# Patient Record
Sex: Female | Born: 1945 | Race: White | Hispanic: No | Smoking: Never smoker
Health system: Southern US, Community
[De-identification: ages and names within clinical notes are randomized; demographics above are authoritative.]

## PROBLEM LIST (undated history)

## (undated) HISTORY — PX: BREAST CYST ASPIRATION: SHX578

---

## 2004-04-23 ENCOUNTER — Ambulatory Visit: Payer: Self-pay | Admitting: Unknown Physician Specialty

## 2004-10-20 ENCOUNTER — Ambulatory Visit: Payer: Self-pay | Admitting: Unknown Physician Specialty

## 2006-01-14 ENCOUNTER — Ambulatory Visit: Payer: Self-pay | Admitting: Unknown Physician Specialty

## 2007-01-24 ENCOUNTER — Ambulatory Visit: Payer: Self-pay | Admitting: Unknown Physician Specialty

## 2008-02-06 ENCOUNTER — Ambulatory Visit: Payer: Self-pay | Admitting: Unknown Physician Specialty

## 2008-02-22 ENCOUNTER — Ambulatory Visit: Payer: Self-pay | Admitting: Unknown Physician Specialty

## 2008-08-26 ENCOUNTER — Ambulatory Visit: Payer: Self-pay | Admitting: Unknown Physician Specialty

## 2009-02-25 ENCOUNTER — Ambulatory Visit: Payer: Self-pay | Admitting: Unknown Physician Specialty

## 2010-03-02 ENCOUNTER — Ambulatory Visit: Payer: Self-pay | Admitting: Unknown Physician Specialty

## 2011-03-25 ENCOUNTER — Ambulatory Visit: Payer: Self-pay | Admitting: Unknown Physician Specialty

## 2011-04-08 ENCOUNTER — Ambulatory Visit: Payer: Self-pay | Admitting: Unknown Physician Specialty

## 2011-05-13 ENCOUNTER — Ambulatory Visit: Payer: Self-pay | Admitting: Otolaryngology

## 2012-04-11 ENCOUNTER — Ambulatory Visit: Payer: Self-pay | Admitting: Physician Assistant

## 2012-10-02 ENCOUNTER — Ambulatory Visit: Payer: Self-pay | Admitting: Neurology

## 2013-12-19 ENCOUNTER — Ambulatory Visit: Payer: Self-pay | Admitting: Physician Assistant

## 2014-01-03 ENCOUNTER — Ambulatory Visit: Payer: Self-pay | Admitting: Gastroenterology

## 2014-01-07 ENCOUNTER — Ambulatory Visit: Payer: Self-pay | Admitting: Gastroenterology

## 2014-01-10 LAB — PATHOLOGY REPORT

## 2016-02-10 ENCOUNTER — Other Ambulatory Visit: Payer: Self-pay | Admitting: Physician Assistant

## 2016-02-10 DIAGNOSIS — R634 Abnormal weight loss: Secondary | ICD-10-CM

## 2016-02-10 DIAGNOSIS — Z8041 Family history of malignant neoplasm of ovary: Secondary | ICD-10-CM

## 2016-02-10 DIAGNOSIS — R198 Other specified symptoms and signs involving the digestive system and abdomen: Secondary | ICD-10-CM

## 2016-02-10 DIAGNOSIS — Z1231 Encounter for screening mammogram for malignant neoplasm of breast: Secondary | ICD-10-CM

## 2016-02-10 DIAGNOSIS — Z8249 Family history of ischemic heart disease and other diseases of the circulatory system: Secondary | ICD-10-CM

## 2016-02-16 ENCOUNTER — Ambulatory Visit
Admission: RE | Admit: 2016-02-16 | Discharge: 2016-02-16 | Disposition: A | Discharge status: Home / Self Care | Payer: Medicare Other | Source: Ambulatory Visit | Attending: Physician Assistant | Admitting: Physician Assistant

## 2016-02-16 ENCOUNTER — Encounter: Payer: Self-pay | Admitting: Radiology

## 2016-02-16 DIAGNOSIS — Z8249 Family history of ischemic heart disease and other diseases of the circulatory system: Secondary | ICD-10-CM

## 2016-02-16 DIAGNOSIS — R198 Other specified symptoms and signs involving the digestive system and abdomen: Secondary | ICD-10-CM

## 2016-02-16 DIAGNOSIS — I77811 Abdominal aortic ectasia: Secondary | ICD-10-CM | POA: Diagnosis not present

## 2016-02-16 DIAGNOSIS — Z8041 Family history of malignant neoplasm of ovary: Secondary | ICD-10-CM

## 2016-02-16 DIAGNOSIS — R634 Abnormal weight loss: Secondary | ICD-10-CM

## 2016-02-27 ENCOUNTER — Ambulatory Visit: Payer: Self-pay | Attending: Physician Assistant

## 2016-08-13 ENCOUNTER — Ambulatory Visit
Admission: RE | Admit: 2016-08-13 | Discharge: 2016-08-13 | Disposition: A | Discharge status: Home / Self Care | Payer: Medicare Other | Source: Ambulatory Visit | Attending: Physician Assistant | Admitting: Physician Assistant

## 2016-08-13 DIAGNOSIS — Z1231 Encounter for screening mammogram for malignant neoplasm of breast: Secondary | ICD-10-CM | POA: Diagnosis not present

## 2020-05-09 ENCOUNTER — Emergency Department (HOSPITAL_COMMUNITY): Payer: Medicare Other

## 2020-05-09 ENCOUNTER — Inpatient Hospital Stay (HOSPITAL_COMMUNITY)
Admission: EM | Admit: 2020-05-09 | Discharge: 2020-05-12 | DRG: 041 | Disposition: A | Discharge status: Home Health | Payer: Medicare Other | Attending: Neurology | Admitting: Neurology

## 2020-05-09 ENCOUNTER — Other Ambulatory Visit: Payer: Self-pay

## 2020-05-09 ENCOUNTER — Encounter (HOSPITAL_COMMUNITY): Payer: Self-pay | Admitting: Emergency Medicine

## 2020-05-09 DIAGNOSIS — N1831 Chronic kidney disease, stage 3a: Secondary | ICD-10-CM | POA: Diagnosis present

## 2020-05-09 DIAGNOSIS — I129 Hypertensive chronic kidney disease with stage 1 through stage 4 chronic kidney disease, or unspecified chronic kidney disease: Secondary | ICD-10-CM | POA: Diagnosis present

## 2020-05-09 DIAGNOSIS — M479 Spondylosis, unspecified: Secondary | ICD-10-CM | POA: Diagnosis present

## 2020-05-09 DIAGNOSIS — D696 Thrombocytopenia, unspecified: Secondary | ICD-10-CM

## 2020-05-09 DIAGNOSIS — D6959 Other secondary thrombocytopenia: Secondary | ICD-10-CM | POA: Diagnosis present

## 2020-05-09 DIAGNOSIS — R4701 Aphasia: Secondary | ICD-10-CM | POA: Diagnosis present

## 2020-05-09 DIAGNOSIS — I639 Cerebral infarction, unspecified: Secondary | ICD-10-CM | POA: Diagnosis present

## 2020-05-09 DIAGNOSIS — I10 Essential (primary) hypertension: Secondary | ICD-10-CM

## 2020-05-09 DIAGNOSIS — G8191 Hemiplegia, unspecified affecting right dominant side: Secondary | ICD-10-CM | POA: Diagnosis present

## 2020-05-09 DIAGNOSIS — Z888 Allergy status to other drugs, medicaments and biological substances status: Secondary | ICD-10-CM | POA: Diagnosis not present

## 2020-05-09 DIAGNOSIS — E785 Hyperlipidemia, unspecified: Secondary | ICD-10-CM

## 2020-05-09 DIAGNOSIS — E876 Hypokalemia: Secondary | ICD-10-CM | POA: Diagnosis present

## 2020-05-09 DIAGNOSIS — I63412 Cerebral infarction due to embolism of left middle cerebral artery: Principal | ICD-10-CM | POA: Diagnosis present

## 2020-05-09 DIAGNOSIS — I6389 Other cerebral infarction: Secondary | ICD-10-CM | POA: Diagnosis not present

## 2020-05-09 DIAGNOSIS — R29706 NIHSS score 6: Secondary | ICD-10-CM | POA: Diagnosis present

## 2020-05-09 DIAGNOSIS — R4781 Slurred speech: Secondary | ICD-10-CM | POA: Diagnosis present

## 2020-05-09 DIAGNOSIS — Z20822 Contact with and (suspected) exposure to covid-19: Secondary | ICD-10-CM | POA: Diagnosis present

## 2020-05-09 LAB — DIFFERENTIAL
Abs Immature Granulocytes: 0.03 10*3/uL (ref 0.00–0.07)
Basophils Absolute: 0 10*3/uL (ref 0.0–0.1)
Basophils Relative: 1 %
Eosinophils Absolute: 0.1 10*3/uL (ref 0.0–0.5)
Eosinophils Relative: 1 %
Immature Granulocytes: 0 %
Lymphocytes Relative: 21 %
Lymphs Abs: 1.5 10*3/uL (ref 0.7–4.0)
Monocytes Absolute: 0.4 10*3/uL (ref 0.1–1.0)
Monocytes Relative: 6 %
Neutro Abs: 5.2 10*3/uL (ref 1.7–7.7)
Neutrophils Relative %: 71 %

## 2020-05-09 LAB — CBG MONITORING, ED: Glucose-Capillary: 119 mg/dL — ABNORMAL HIGH (ref 70–99)

## 2020-05-09 LAB — CBC
HCT: 40.5 % (ref 36.0–46.0)
Hemoglobin: 13.4 g/dL (ref 12.0–15.0)
MCH: 30.9 pg (ref 26.0–34.0)
MCHC: 33.1 g/dL (ref 30.0–36.0)
MCV: 93.5 fL (ref 80.0–100.0)
Platelets: 194 10*3/uL (ref 150–400)
RBC: 4.33 MIL/uL (ref 3.87–5.11)
RDW: 11.9 % (ref 11.5–15.5)
WBC: 7.3 10*3/uL (ref 4.0–10.5)
nRBC: 0 % (ref 0.0–0.2)

## 2020-05-09 LAB — I-STAT CHEM 8, ED
BUN: 16 mg/dL (ref 8–23)
Calcium, Ion: 1.15 mmol/L (ref 1.15–1.40)
Chloride: 105 mmol/L (ref 98–111)
Creatinine, Ser: 1.2 mg/dL — ABNORMAL HIGH (ref 0.44–1.00)
Glucose, Bld: 117 mg/dL — ABNORMAL HIGH (ref 70–99)
HCT: 37 % (ref 36.0–46.0)
Hemoglobin: 12.6 g/dL (ref 12.0–15.0)
Potassium: 3.3 mmol/L — ABNORMAL LOW (ref 3.5–5.1)
Sodium: 140 mmol/L (ref 135–145)
TCO2: 25 mmol/L (ref 22–32)

## 2020-05-09 LAB — COMPREHENSIVE METABOLIC PANEL
ALT: 11 U/L (ref 0–44)
AST: 15 U/L (ref 15–41)
Albumin: 3.7 g/dL (ref 3.5–5.0)
Alkaline Phosphatase: 108 U/L (ref 38–126)
Anion gap: 11 (ref 5–15)
BUN: 13 mg/dL (ref 8–23)
CO2: 24 mmol/L (ref 22–32)
Calcium: 9.3 mg/dL (ref 8.9–10.3)
Chloride: 105 mmol/L (ref 98–111)
Creatinine, Ser: 1.23 mg/dL — ABNORMAL HIGH (ref 0.44–1.00)
GFR, Estimated: 46 mL/min — ABNORMAL LOW (ref >60)
Glucose, Bld: 124 mg/dL — ABNORMAL HIGH (ref 70–99)
Potassium: 3.2 mmol/L — ABNORMAL LOW (ref 3.5–5.1)
Sodium: 140 mmol/L (ref 135–145)
Total Bilirubin: 0.9 mg/dL (ref 0.3–1.2)
Total Protein: 6.2 g/dL — ABNORMAL LOW (ref 6.5–8.1)

## 2020-05-09 LAB — PROTIME-INR
INR: 1 (ref 0.8–1.2)
Prothrombin Time: 12.5 seconds (ref 11.4–15.2)

## 2020-05-09 LAB — RESP PANEL BY RT-PCR (FLU A&B, COVID) ARPGX2
Influenza A by PCR: NEGATIVE
Influenza B by PCR: NEGATIVE
SARS Coronavirus 2 by RT PCR: NEGATIVE

## 2020-05-09 LAB — APTT: aPTT: 26 seconds (ref 24–36)

## 2020-05-09 MED ORDER — SODIUM CHLORIDE 0.9 % IV SOLN: INTRAVENOUS | Status: DC

## 2020-05-09 MED ORDER — ALTEPLASE 100 MG IV SOLR
50.0000 mg | Freq: Once | INTRAVENOUS | Status: DC
Start: 2020-05-09 — End: 2020-05-09
  Administered 2020-05-09: 50 mg via INTRAVENOUS

## 2020-05-09 MED ORDER — ACETAMINOPHEN 160 MG/5ML PO SOLN: 650.0000 mg | ORAL | Status: DC | PRN

## 2020-05-09 MED ORDER — IOHEXOL 350 MG/ML SOLN
75.0000 mL | Freq: Once | INTRAVENOUS | Status: AC | PRN
  Administered 2020-05-09: 75 mL via INTRAVENOUS

## 2020-05-09 MED ORDER — SENNOSIDES-DOCUSATE SODIUM 8.6-50 MG PO TABS: 1.0000 | ORAL_TABLET | Freq: Every evening | ORAL | Status: DC | PRN

## 2020-05-09 MED ORDER — LABETALOL HCL 5 MG/ML IV SOLN: 20.0000 mg | Freq: Once | INTRAVENOUS | Status: DC

## 2020-05-09 MED ORDER — ALTEPLASE (STROKE) FULL DOSE INFUSION
0.9000 mg/kg | Freq: Once | INTRAVENOUS | Status: AC
  Administered 2020-05-09: 47.7 mg via INTRAVENOUS
  Filled 2020-05-09: qty 100

## 2020-05-09 MED ORDER — SODIUM CHLORIDE 0.9% FLUSH: 3.0000 mL | Freq: Once | INTRAVENOUS | Status: DC

## 2020-05-09 MED ORDER — STROKE: EARLY STAGES OF RECOVERY BOOK
Freq: Once | Status: AC
  Filled 2020-05-09: qty 1

## 2020-05-09 MED ORDER — ACETAMINOPHEN 325 MG PO TABS
650.0000 mg | ORAL_TABLET | ORAL | Status: DC | PRN
  Administered 2020-05-10 – 2020-05-12 (×2): 650 mg via ORAL
  Filled 2020-05-09 (×3): qty 2

## 2020-05-09 MED ORDER — CLEVIDIPINE BUTYRATE 0.5 MG/ML IV EMUL: 0.0000 mg/h | INTRAVENOUS | Status: DC

## 2020-05-09 MED ORDER — PANTOPRAZOLE SODIUM 40 MG IV SOLR
40.0000 mg | Freq: Every day | INTRAVENOUS | Status: DC
  Administered 2020-05-09 – 2020-05-11 (×3): 40 mg via INTRAVENOUS
  Filled 2020-05-09 (×2): qty 40

## 2020-05-09 MED ORDER — ACETAMINOPHEN 650 MG RE SUPP: 650.0000 mg | RECTAL | Status: DC | PRN

## 2020-05-09 MED ORDER — SODIUM CHLORIDE 0.9 % IV SOLN
50.0000 mL | Freq: Once | INTRAVENOUS | Status: AC
  Administered 2020-05-09: 50 mL via INTRAVENOUS

## 2020-05-09 NOTE — ED Provider Notes (Signed)
[facility] EMERGENCY DEPARTMENT Provider Note   CSN: [id number] Arrival date & time: 05/09/20  8295  An emergency department physician performed an initial assessment on this suspected stroke patient at 1825.  History Chief Complaint  Patient presents with  . Code Stroke    TRINITA DEVLIN is a 74 y.o. female.  74 year old female brought in by EMS for sudden onset right sided weakness and word finding difficulty. Daughter at bedside reports patient complained of heart racing last week, has never had this before has not complained of this since.         History reviewed. No pertinent past medical history.  Patient Active Problem List   Diagnosis Date Noted  . Acute ischemic stroke (HCC) 05/09/2020    Past Surgical History:  Procedure Laterality Date  . BREAST CYST ASPIRATION Right      OB History   No obstetric history on file.     Family History  Problem Relation Age of Onset  . Breast cancer Neg Hx     Social History   Tobacco Use  . Smoking status: Never Smoker  . Smokeless tobacco: Never Used  Substance Use Topics  . Alcohol use: Never  . Drug use: Never    Home Medications Prior to Admission medications   Not on File    Allergies    Patient has no allergy information on record.  Review of Systems   Review of Systems  Unable to perform ROS: Acuity of condition  Neurological: Positive for speech difficulty, weakness and numbness.    Physical Exam Updated Vital Signs BP (!) 151/72   Pulse 70   Temp 98.3 F (36.8 C) (Oral)   Resp 17   Ht 5\' 4"  (1.626 m)   Wt 53 kg   SpO2 99%   BMI 20.06 kg/m   Physical Exam Vitals and nursing note reviewed.  Constitutional:      Appearance: She is normal weight. She is not toxic-appearing.  HENT:     Head: Normocephalic and atraumatic.     Nose: Nose normal.     Mouth/Throat:     Mouth: Mucous membranes are moist.  Eyes:     Extraocular Movements: Extraocular movements  intact.     Pupils: Pupils are equal, round, and reactive to light.  Cardiovascular:     Rate and Rhythm: Normal rate and regular rhythm.     Pulses: Normal pulses.     Heart sounds: Normal heart sounds. No murmur heard.   Pulmonary:     Effort: Pulmonary effort is normal.     Breath sounds: Normal breath sounds.  Abdominal:     Palpations: Abdomen is soft.     Tenderness: There is no abdominal tenderness.  Musculoskeletal:     Right lower leg: No edema.     Left lower leg: No edema.  Skin:    General: Skin is warm and dry.     Findings: No erythema or rash.  Neurological:     Mental Status: She is alert.     Cranial Nerves: No facial asymmetry.     Sensory: Sensory deficit present.     Motor: Weakness present.     Comments: Right arm and right leg weakness, diminished sensation right      ED Results / Procedures / Treatments   Labs (all labs ordered are listed, but only abnormal results are displayed) Labs Reviewed  COMPREHENSIVE METABOLIC PANEL - Abnormal; Notable for the following components:  Result Value   Potassium 3.2 (*)    Glucose, Bld 124 (*)    Creatinine, Ser 1.23 (*)    Total Protein 6.2 (*)    GFR, Estimated 46 (*)    All other components within normal limits  CBG MONITORING, ED - Abnormal; Notable for the following components:   Glucose-Capillary 119 (*)    All other components within normal limits  I-STAT CHEM 8, ED - Abnormal; Notable for the following components:   Potassium 3.3 (*)    Creatinine, Ser 1.20 (*)    Glucose, Bld 117 (*)    All other components within normal limits  RESP PANEL BY RT-PCR (FLU A&B, COVID) [id number]  PROTIME-INR  APTT  CBC  DIFFERENTIAL  HEMOGLOBIN A1C  LIPID PANEL  CBG MONITORING, ED    EKG EKG Interpretation  Date/Time:  Friday May 09 2020 19:19:19 EST Ventricular Rate:  69 PR Interval:    QRS Duration: 102 QT Interval:  446 QTC Calculation: 478 R Axis:   -17 Text Interpretation: Sinus rhythm  Borderline left axis deviation Nonspecific T wave abnormality Confirmed by Cathren LaineSteinl, Kevin ([id number]) on 05/09/2020 7:29:35 PM   Radiology CT Code Stroke CTA Head W/WO contrast  Result Date: 05/09/2020 CLINICAL DATA:  Stroke, right leg weakness. EXAM: CT ANGIOGRAPHY HEAD AND NECK TECHNIQUE: Multidetector CT imaging of the head and neck was performed using the standard protocol during bolus administration of intravenous contrast. Multiplanar CT image reconstructions and MIPs were obtained to evaluate the vascular anatomy. Carotid stenosis measurements (when applicable) are obtained utilizing NASCET criteria, using the distal internal carotid diameter as the denominator. CONTRAST:  75mL OMNIPAQUE IOHEXOL 350 MG/ML SOLN COMPARISON:  Noncontrast head CT performed earlier the same day 05/09/2020 FINDINGS: CTA NECK FINDINGS Aortic arch: Common origin of the innominate and left common carotid arteries. Minimal calcified plaque within the visualized aortic arch. No hemodynamically significant innominate or proximal subclavian artery stenosis. Right carotid system: CCA and ICA patent within the neck without stenosis. Minimal atherosclerotic plaque within the proximal ICA. Left carotid system: CCA and ICA patent within the neck without significant stenosis (50% or greater). Mild to moderate calcified plaque within the carotid bifurcation and proximal ICA. Vertebral arteries: Codominant and patent within the neck without stenosis. Skeleton: No acute bony abnormality or aggressive osseous lesion. Other neck: No neck mass or cervical lymphadenopathy. Poorly delineated thyroid gland which may be atrophic or surgically absent. Upper chest: No consolidation within the imaged lung apices. Review of the MIP images confirms the above findings CTA HEAD FINDINGS Anterior circulation: The intracranial internal carotid arteries are patent. Mild plaque within both vessels without stenosis. The M1 middle cerebral arteries are patent. No M2  proximal branch occlusion is identified. High-grade stenosis within a distal M2 left MCA branch vessel (series 12, image 28) (series 10, image 17). The anterior cerebral arteries are patent. Severe focal stenosis within the mid to distal A2 left anterior cerebral artery (series seven, image 68). No intracranial aneurysm is identified. Posterior circulation: The intracranial vertebral arteries are patent. The basilar artery is patent. The posterior cerebral arteries are patent. Posterior communicating arteries are present bilaterally. Venous sinuses: Within the limitations of contrast timing, no convincing thrombus. Anatomic variants: None significant Review of the MIP images confirms the above findings These results were called by telephone at the time of interpretation on 05/09/2020 at 7:05 pm to provider [facility] , who verbally acknowledged these results. IMPRESSION: CTA neck: The common carotid, internal carotid and vertebral arteries are patent  within the neck without hemodynamically significant stenosis. Atherosclerotic plaque within the carotid systems as described. CTA head: 1. Severe focal stenosis within the mid to distal A2 left anterior cerebral artery. 2. Severe stenosis within a distal M2 left MCA vessel. Electronically Signed   By: Jackey Loge DO   On: 05/09/2020 19:23   CT Code Stroke CTA Neck W/WO contrast  Result Date: 05/09/2020 CLINICAL DATA:  Stroke, right leg weakness. EXAM: CT ANGIOGRAPHY HEAD AND NECK TECHNIQUE: Multidetector CT imaging of the head and neck was performed using the standard protocol during bolus administration of intravenous contrast. Multiplanar CT image reconstructions and MIPs were obtained to evaluate the vascular anatomy. Carotid stenosis measurements (when applicable) are obtained utilizing NASCET criteria, using the distal internal carotid diameter as the denominator. CONTRAST:  68mL OMNIPAQUE IOHEXOL 350 MG/ML SOLN COMPARISON:  Noncontrast head CT performed  earlier the same day 05/09/2020 FINDINGS: CTA NECK FINDINGS Aortic arch: Common origin of the innominate and left common carotid arteries. Minimal calcified plaque within the visualized aortic arch. No hemodynamically significant innominate or proximal subclavian artery stenosis. Right carotid system: CCA and ICA patent within the neck without stenosis. Minimal atherosclerotic plaque within the proximal ICA. Left carotid system: CCA and ICA patent within the neck without significant stenosis (50% or greater). Mild to moderate calcified plaque within the carotid bifurcation and proximal ICA. Vertebral arteries: Codominant and patent within the neck without stenosis. Skeleton: No acute bony abnormality or aggressive osseous lesion. Other neck: No neck mass or cervical lymphadenopathy. Poorly delineated thyroid gland which may be atrophic or surgically absent. Upper chest: No consolidation within the imaged lung apices. Review of the MIP images confirms the above findings CTA HEAD FINDINGS Anterior circulation: The intracranial internal carotid arteries are patent. Mild plaque within both vessels without stenosis. The M1 middle cerebral arteries are patent. No M2 proximal branch occlusion is identified. High-grade stenosis within a distal M2 left MCA branch vessel (series 12, image 28) (series 10, image 17). The anterior cerebral arteries are patent. Severe focal stenosis within the mid to distal A2 left anterior cerebral artery (series seven, image 68). No intracranial aneurysm is identified. Posterior circulation: The intracranial vertebral arteries are patent. The basilar artery is patent. The posterior cerebral arteries are patent. Posterior communicating arteries are present bilaterally. Venous sinuses: Within the limitations of contrast timing, no convincing thrombus. Anatomic variants: None significant Review of the MIP images confirms the above findings These results were called by telephone at the time of  interpretation on 05/09/2020 at 7:05 pm to provider [facility] , who verbally acknowledged these results. IMPRESSION: CTA neck: The common carotid, internal carotid and vertebral arteries are patent within the neck without hemodynamically significant stenosis. Atherosclerotic plaque within the carotid systems as described. CTA head: 1. Severe focal stenosis within the mid to distal A2 left anterior cerebral artery. 2. Severe stenosis within a distal M2 left MCA vessel. Electronically Signed   By: Jackey Loge DO   On: 05/09/2020 19:23   CT HEAD CODE STROKE WO CONTRAST  Result Date: 05/09/2020 CLINICAL DATA:  Neuro deficit, acute, stroke suspected. Right leg weakness. EXAM: CT HEAD WITHOUT CONTRAST TECHNIQUE: Contiguous axial images were obtained from the base of the skull through the vertex without intravenous contrast. COMPARISON:  Brain MRI 05/13/2011. FINDINGS: Brain: Mild cerebral atrophy. Moderate ill-defined hypoattenuation within the cerebral white matter is nonspecific, but compatible with chronic small vessel ischemic disease. 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 There is no acute  intracranial hemorrhage. No demarcated cortical infarct. No extra-axial fluid collection. No evidence of intracranial mass. No midline shift. Vascular: No hyperdense vessel.  Atherosclerotic calcifications. Skull: Normal. Negative for fracture or focal lesion. Sinuses/Orbits: Visualized orbits show no acute finding. Mild ethmoid and maxillary sinus mucosal thickening. ASPECTS (Alberta Stroke Program Early CT Score) - Ganglionic level infarction (caudate, lentiform nuclei, internal capsule, insula, M1-M3 cortex): 7 - Supraganglionic infarction (M4-M6 cortex): 3 Total score (0-10 with 10 being normal): 10 These results were called by telephone at the time of interpretation on 05/09/2020 at 6:41 pm to provider Dr. Wilford Corner, who verbally acknowledged these results. IMPRESSION: No evidence  of acute intracranial abnormality. Mild cerebral atrophy with moderate chronic small vessel ischemic disease, progressed as compared to the brain MRI of 05/13/2011. 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 Electronically Signed   By: Jackey Loge DO   On: 05/09/2020 18:41    Procedures Procedures (including critical care time)  Medications Ordered in ED Medications  sodium chloride flush (NS) 0.9 % injection 3 mL (3 mLs Intravenous Not Given 05/09/20 1906)   stroke: mapping our early stages of recovery book (has no administration in time range)  0.9 %  sodium chloride infusion (has no administration in time range)  acetaminophen (TYLENOL) tablet 650 mg (has no administration in time range)    Or  acetaminophen (TYLENOL) 160 MG/5ML solution 650 mg (has no administration in time range)    Or  acetaminophen (TYLENOL) suppository 650 mg (has no administration in time range)  senna-docusate (Senokot-S) tablet 1 tablet (has no administration in time range)  pantoprazole (PROTONIX) injection 40 mg (has no administration in time range)  labetalol (NORMODYNE) injection 20 mg (0 mg Intravenous Hold 05/09/20 1923)    And  clevidipine (CLEVIPREX) infusion 0.5 mg/mL (0 mg/hr Intravenous Hold 05/09/20 1923)  iohexol (OMNIPAQUE) 350 MG/ML injection 75 mL (75 mLs Intravenous Contrast Given 05/09/20 1843)  alteplase (ACTIVASE) 1 mg/mL infusion 47.7 mg (47.7 mg Intravenous New Bag/Given 05/09/20 1846)    Followed by  0.9 %  sodium chloride infusion (50 mLs Intravenous New Bag/Given 05/09/20 1949)    ED Course  I have reviewed the triage vital signs and the nursing notes.  Pertinent labs & imaging results that were available during my care of the patient were reviewed by me and considered in my medical decision making (see chart for details).  Clinical Course as of May 10 1955  Fri May 09, 2020  546 74 year old female brought in by EMS for right side weakness and  word finding difficulty. Code stroke activated and patient was taken straight to CT. Patient was seen by Dr. Wilford Corner, neurohospitalist. TPA ordered, admit for stroke.  On exam, found to have right side weakness/decreased sensation.   [LM]    Clinical Course User Index [LM] Alden Hipp   MDM Rules/Calculators/A&P                          Final Clinical Impression(s) / ED Diagnoses Final diagnoses:  Cerebrovascular accident (CVA), unspecified mechanism [facility])    Rx / DC Orders ED Discharge Orders    None       Alden Hipp 05/09/20 1956    Cathren Laine, MD 05/09/20 2233

## 2020-05-09 NOTE — Progress Notes (Signed)
PHARMACIST CODE STROKE RESPONSE  Notified to mix tPA at 1840 by Dr. Wilford Corner Delivered tPA to RN at 1845  tPA dose = 4.8mg  bolus over 1 minute followed by 42.9mg  for a total dose of 47.7mg  over 1 hour  Issues/delays encountered (if applicable): N/a  Joaquim Lai PharmD. BCPS 05/09/20 7:19 PM

## 2020-05-09 NOTE — H&P (Signed)
H&P - NEUROLOGY  CC: Right-sided weakness, speech difficulty  History is obtained from: Patient, daughter  HPI: Shelby Keller is a 74 y.o. female past medical history of degenerative disc disease, thyroid disease, presents to the emergency room for sudden onset right sided weakness and word finding difficulty. She was usual state of health-after talking to the daughter in detail-at around 3:30 PM when she ate with her grandson and appeared normal.  Around 5 PM, the family member checked and she was sitting up the stairs not moving.  She had difficulty with her words.  She was also not moving her right leg-that appeared stiff.  She is not able to move her right arm as strongly as well. She reports that she had started having some trouble walking upstairs at 11-12 but the family does not corroborate that. Last known well after speaking with multiple people is 3:30 PM today. Denies any headache.  Denies visual symptoms.  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  ROS: Performed and negative except as noted in HPI  No past medical history on file.  Noted in HPI  Family History  Problem Relation Age of Onset  . Breast cancer Neg Hx     Social History:   has no history on file for tobacco use, alcohol use, and drug use.  Medications  Current Facility-Administered Medications:  .  sodium chloride flush (NS) 0.9 % injection 3 mL, 3 mL, Intravenous, Once, Cathren Laine, MD No current outpatient medications on file.  Exam: Current vital signs: BP (!) 152/88   Pulse 70   Resp 18   Wt 53 kg   SpO2 97%  Vital signs in last 24 hours: Pulse Rate:  [69-70] 70 (12/03 1840) Resp:  [18] 18 (12/03 1836) BP: (152-166)/(71-88) 152/88 (12/03 1840) SpO2:  [97 %-99 %] 97 % (12/03 1840) Weight:  [53 kg] 53 kg (12/03 1835) General: Awake  alert in no distress HEENT: [place] rheumatic Lungs: Clear Cardiovascular: Regular rhythm Abdomen soft nondistended nontender Extremities warm well perfused Neurological exam Awake alert oriented x3 My dysarthria No aphasia Cranial: Pupils equal round react light, extraocular movements intact, visual fields full, face appears symmetric, tongue and palate midline. Motor examination: Right leg is 1/5, right upper extremity is 4/5 with drift.  Left upper and lower extremity no drift. Sensory examination: Symmetric Coordination: No dysmetria NIHSS 1a Level of Conscious.: 0 1b LOC Questions: 0 1c LOC Commands: 0 2 Best Gaze: 0 3 Visual: 0 4 Facial Palsy: 0 5a Motor Arm - left: 0 5b Motor Arm - Right: 1 6a Motor Leg - Left: 0 6b Motor Leg - Right: 3 7 Limb Ataxia: 0 8 Sensory: 0 9 Best Language: 0 10 Dysarthria: 1 11 Extinct. and Inatten.: 0 TOTAL: 5    Labs I have reviewed labs in epic and the results pertinent to this consultation are:  CBC    Component Value Date/Time   WBC 7.3 05/09/2020 1830   RBC 4.33 05/09/2020 1830   HGB 12.6 05/09/2020 1832   HCT 37.0 05/09/2020 1832   PLT 194 05/09/2020 1830   MCV 93.5 05/09/2020 1830   MCH 30.9 05/09/2020 1830   MCHC 33.1 05/09/2020 1830   RDW 11.9 05/09/2020 1830   LYMPHSABS 1.5 05/09/2020 1830   MONOABS 0.4 05/09/2020 1830   EOSABS 0.1 05/09/2020 1830  BASOSABS 0.0 05/09/2020 1830    CMP     Component Value Date/Time   NA 140 05/09/2020 1832   K 3.3 (L) 05/09/2020 1832   CL 105 05/09/2020 1832   GLUCOSE 117 (H) 05/09/2020 1832   BUN 16 05/09/2020 1832   CREATININE 1.20 (H) 05/09/2020 1832   Imaging I have reviewed the images obtained: CT-scan of the brain- ASPECTS 10, no bleed. CTA Head and Neck:  IMPRESSION: CTA neck: The common carotid, internal carotid and vertebral arteries are patent within the neck without hemodynamically significant stenosis. Atherosclerotic plaque within the carotid systems as  described.  CTA head: 1. Severe focal stenosis within the mid to distal A2 left anterior cerebral artery. 2. Severe stenosis within a distal M2 left MCA vessel.   Assessment:  74 year old woman with above past medical history with sudden onset of right-sided hemiparesis-leg worse than arm, with last known well at 3:30 PM. No contraindications to TPA Risks and benefits discussed with the daughter over the phone who consented Vessel imaging with multiple stenosis-most notably mid to distal left A2 and distal M2.  No occlusion for endovascular thrombectomy  Impression: Left ACA stroke given more leg weakness As suspected, imaging of the head and neck blood vessels showed stenosis in mid to distal left A2 and severe distal left admit to neurological ICU stenosis.  tPA given-not amenable for thrombectomy due to no occlusion.  Recommendations: Admit to neurological ICU Frequent neurochecks according to TPA protocol Systolic blood pressure less than 180-use labetalol and hydralazine as necessary.  If drip as needed, use Cleviprex. No antiplatelets or anticoagulants for at least 24 hours unless 24-hour post TPA imaging is negative for bleed. MRI in 24 hours Gentle hydration with fluids-normal saline 75 cc an hour Check labs in the morning and replete as necessary. PT OT speech therapy N.p.o. until cleared by stroke swallow screen or formal swallow evaluation 2D echo A1c next lipid panel   Discussed the plan in detail with the daughter-who is appreciative of the care.  Stroke neurology will continue to follow.  -- Milon Dikes, MD [facility] Pager: [phone] If 7pm to 7am, please call on call as listed on AMION.   CRITICAL CARE ATTESTATION Performed by: Milon Dikes, MD Total critical care time:  minutes Critical care time was exclusive of separately billable procedures and treating other patients and/or supervising APPs/Residents/Students Critical care was  necessary to treat or prevent imminent or life-threatening deterioration due to acute ischemic stroke This patient is critically ill and at significant risk for neurological worsening and/or death and care requires constant monitoring. Critical care was time spent personally by me on the following activities: development of treatment plan with patient and/or surrogate as well as nursing, discussions with consultants, evaluation of patient's response to treatment, examination of patient, obtaining history from patient or surrogate, ordering and performing treatments and interventions, ordering and review of laboratory studies, ordering and review of radiographic studies, pulse oximetry, re-evaluation of patient's condition, participation in multidisciplinary rounds and medical decision making of high complexity in the care of this patient.

## 2020-05-09 NOTE — ED Triage Notes (Signed)
Pt brought to ED by EMS from home for c/o slurred speech, right side weakness and facial droop. Per pt she started having problems going up stairs today between 11 am to 12 pm. BP 151/73, HR 68, SPO2 99% RA, CBG 143.

## 2020-05-09 NOTE — Progress Notes (Signed)
Hearing aids, shoes, clothing and glasses sent home with patient's daughter.

## 2020-05-09 NOTE — Code Documentation (Signed)
Stroke Response Nurse Documentation Code Documentation  Shelby Keller is a 74 y.o. female arriving to [facility] ED via [facility] on 05/09/2020 with past medical hx of vertigo, cervical DDD, tremor. Code stroke was activated by EMS. Patient from home where she was LKW at 1530 and now complaining of Right leg weakness and sudden loss of speech . On No antithrombotic. Stroke team at the bedside on patient arrival. Labs drawn and patient cleared for CT by Dr. Kathrene Bongo. Patient to CT with team. NIHSS 6, see documentation for details and code stroke times. Patient with right leg weakness, right decreased sensation and dysarthria  on exam. The following imaging was completed:  CTand CTA head and neck.. Patient is a candidate for tPA, Care/Plan. Bedside handoff with ED RN Grier Rocher.    Marcellina Millin  Stroke Response RN

## 2020-05-10 ENCOUNTER — Inpatient Hospital Stay (HOSPITAL_COMMUNITY): Payer: Medicare Other

## 2020-05-10 ENCOUNTER — Encounter (HOSPITAL_COMMUNITY): Payer: Self-pay | Admitting: Student in an Organized Health Care Education/Training Program

## 2020-05-10 DIAGNOSIS — I6389 Other cerebral infarction: Secondary | ICD-10-CM | POA: Diagnosis not present

## 2020-05-10 DIAGNOSIS — I639 Cerebral infarction, unspecified: Secondary | ICD-10-CM | POA: Diagnosis not present

## 2020-05-10 LAB — LIPID PANEL
Cholesterol: 246 mg/dL — ABNORMAL HIGH (ref 0–200)
HDL: 46 mg/dL (ref >40)
LDL Cholesterol: 182 mg/dL — ABNORMAL HIGH (ref 0–99)
Total CHOL/HDL Ratio: 5.3 RATIO
Triglycerides: 88 mg/dL (ref <150)
VLDL: 18 mg/dL (ref 0–40)

## 2020-05-10 LAB — HEMOGLOBIN A1C
Hgb A1c MFr Bld: 4.7 % — ABNORMAL LOW (ref 4.8–5.6)
Mean Plasma Glucose: 88.19 mg/dL

## 2020-05-10 LAB — ECHOCARDIOGRAM COMPLETE
Area-P 1/2: 3.65 cm2
Height: 64 in
S' Lateral: 2.1 cm
Weight: 1869.5 oz

## 2020-05-10 LAB — MRSA PCR SCREENING: MRSA by PCR: NEGATIVE

## 2020-05-10 MED ORDER — POTASSIUM CHLORIDE 10 MEQ/100ML IV SOLN
10.0000 meq | INTRAVENOUS | Status: AC
  Administered 2020-05-10 (×2): 10 meq via INTRAVENOUS
  Filled 2020-05-10 (×2): qty 100

## 2020-05-10 MED ORDER — INFLUENZA VAC A&B SA ADJ QUAD 0.5 ML IM PRSY
0.5000 mL | PREFILLED_SYRINGE | INTRAMUSCULAR | Status: DC
  Filled 2020-05-10: qty 0.5

## 2020-05-10 MED ORDER — PNEUMOCOCCAL VAC POLYVALENT 25 MCG/0.5ML IJ INJ: 0.5000 mL | INJECTION | INTRAMUSCULAR | Status: DC

## 2020-05-10 MED ORDER — SODIUM CHLORIDE 0.9 % IV BOLUS
500.0000 mL | Freq: Once | INTRAVENOUS | Status: AC
  Administered 2020-05-10: 500 mL via INTRAVENOUS

## 2020-05-10 MED ORDER — SODIUM CHLORIDE 0.9 % IV SOLN: INTRAVENOUS | Status: DC

## 2020-05-10 MED ORDER — CHLORHEXIDINE GLUCONATE CLOTH 2 % EX PADS
6.0000 | MEDICATED_PAD | Freq: Every day | CUTANEOUS | Status: DC
  Administered 2020-05-10: 6 via TOPICAL

## 2020-05-10 MED ORDER — ASPIRIN EC 81 MG PO TBEC
81.0000 mg | DELAYED_RELEASE_TABLET | Freq: Every day | ORAL | Status: DC
  Administered 2020-05-10 – 2020-05-12 (×3): 81 mg via ORAL
  Filled 2020-05-10 (×3): qty 1

## 2020-05-10 NOTE — Progress Notes (Signed)
At 1400 assessment pt only able to state name and where she was. Pt previously could have a fluent conversation with speech only slightly slurred. She was also unable to lift her right arm. This lasted approximately 5 min. Recently returned from MRI. Dr. [name] notified and came to room to assess pt. Dr. [name] ordered her a 500 NS bolus and for her to remain in bed the rest of the day. Will continue to monitor.

## 2020-05-10 NOTE — Progress Notes (Signed)
PT Cancellation Note  Patient Details Name: Shelby Keller MRN: [id number] DOB: [birth year]   Cancelled Treatment:    Reason Eval/Treat Not Completed: Medical issues which prohibited therapy at this time as pt with recent change in neuro status. PT hold at this time until further assessment can be completed by medical team, but will continue to follow and evaluate when appropriate.   Rolm Baptise, PT, DPT   Acute Rehabilitation Department Pager #: [phone]   Gaetana Michaelis 05/10/2020, 3:22 PM

## 2020-05-10 NOTE — Progress Notes (Signed)
Patient passed bedside swallow screen with nurse. 

## 2020-05-10 NOTE — Progress Notes (Signed)
  Echocardiogram 2D Echocardiogram has been performed.  Shelby Keller 05/10/2020, 3:35 PM

## 2020-05-10 NOTE — Progress Notes (Signed)
STROKE TEAM PROGRESS NOTE   HISTORY OF PRESENT ILLNESS (per record) Shelby Keller is a 74 y.o. female past medical history of degenerative disc disease, thyroid disease, presents to the emergency room for sudden onset right sided weakness and word finding difficulty. She was usual state of health-after talking to the daughter in detail-at around 3:30 PM when she ate with her grandson and appeared normal.  Around 5 PM, the family member checked and she was sitting up the stairs not moving.  She had difficulty with her words.  She was also not moving her right leg-that appeared stiff.  She is not able to move her right arm as strongly as well. She reports that she had started having some trouble walking upstairs at 11-12 but the family does not corroborate that. Last known well after speaking with multiple people is 3:30 PM today. Denies any headache.  Denies visual symptoms. 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   INTERVAL HISTORY Her RN is at the bedside.   She presented mild aphasia and right sided weakness and was given IV TPA.  CT angiogram shows left A2 and left M2 stenosis.  MRI scan shows patchy left anterior cerebral artery infarct without any hemorrhage.  Echocardiogram is pending.  LDL cholesterol is elevated 182 mg percent and hemoglobin A1c 4.7.   OBJECTIVE Vitals:   05/10/20 0200 05/10/20 0300 05/10/20 0400 05/10/20 0500  BP: 100/63 (!) 97/56 (!) 107/54 124/66  Pulse: 73 64 66 65  Resp: 20 11 11 15   Temp:   98.1 F (36.7 C)   TempSrc:   Oral   SpO2: 94% 94% 96% 95%  Weight:      Height:        CBC:  Recent Labs  Lab 05/09/20 1830 05/09/20 1832  WBC 7.3  --   NEUTROABS 5.2  --   HGB 13.4 12.6  HCT 40.5 37.0  MCV 93.5  --   PLT 194  --     Basic Metabolic Panel:  Recent Labs  Lab 05/09/20 1830  05/09/20 1832  NA 140 140  K 3.2* 3.3*  CL 105 105  CO2 24  --   GLUCOSE 124* 117*  BUN 13 16  CREATININE 1.23* 1.20*  CALCIUM 9.3  --     Lipid Panel:     Component Value Date/Time   CHOL 246 (H) 05/10/2020 0103   TRIG 88 05/10/2020 0103   HDL 46 05/10/2020 0103   CHOLHDL 5.3 05/10/2020 0103   VLDL 18 05/10/2020 0103   LDLCALC 182 (H) 05/10/2020 0103   HgbA1c:  Lab Results  Component Value Date   HGBA1C 4.7 (L) 05/10/2020   Urine Drug Screen: No results found for: LABOPIA, COCAINSCRNUR, LABBENZ, AMPHETMU, THCU, LABBARB  Alcohol Level No results found for: [facility]  IMAGING  CT Code Stroke CTA Head W/WO contrast CT Code Stroke CTA Neck W/WO contrast 05/09/2020 IMPRESSION:   CTA neck:  The common carotid, internal carotid and vertebral arteries are patent within the neck without hemodynamically significant stenosis. Atherosclerotic plaque within the carotid systems as described.   CTA head:  1. Severe focal stenosis within the mid to distal A2 left anterior cerebral artery.  2. Severe stenosis within a distal M2 left MCA vessel.   CT HEAD CODE STROKE WO CONTRAST 05/09/2020 IMPRESSION:  No evidence of acute  intracranial abnormality. Mild cerebral atrophy with moderate chronic small vessel ischemic disease, progressed as compared to the brain MRI of 05/13/2011. 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   MRI BRAIN WO CONTRAST -patchy left anterior cerebral artery acute infarct.  No hemorrhage  transthoracic Echocardiogram  00/00/2021 Pending  ECG - SR rate 69 BPM. (See cardiology reading for complete details)  PHYSICAL EXAM Blood pressure 124/66, pulse 65, temperature 98.1 F (36.7 C), temperature source Oral, resp. rate 15, height 5\' 4"  (1.626 m), weight 53 kg, SpO2 95 %. Pleasant elderly Caucasian lady not in distress. . Afebrile. Head is nontraumatic. Neck is supple without bruit.    Cardiac exam no murmur or gallop. Lungs  are clear to auscultation. Distal pulses are well felt. Neurological Exam ;  Awake alert oriented to time place and person.  Speech is slightly hesitant and nonfluent with occasional word finding difficulties but no dysarthria.  Follows commands well.  Extraocular movements are full range without nystagmus.  Slight right lower facial asymmetry.  Tongue midline.  No upper extremity drift but slight diminished fine finger movements on the right and orbits left over right upper extremity.  Right lower extremity weakness with 2/5 strength with slight increased tone.  Sensation intact bilaterally.  Plantars downgoing.     ASSESSMENT/PLAN [name] is a 74 y.o. female with history of degenerative disc disease and thyroid disease, presents to the emergency room for sudden onset right sided weakness and word finding difficulty. The patient received IV t-PA Friday 05/09/20 at 1930  Stroke like syndrome: MRI pending  Resultant right leg weakness and mild aphasia   code Stroke CT Head - No evidence of acute intracranial abnormality. Mild cerebral atrophy with moderate chronic small vessel ischemic disease, progressed as compared to the brain MRI of 05/13/2011. 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   CT head - not ordered  MRI head -patchy left ACA territory infarct.  No hemorrhage.  MRA head - not ordered  CTA H&N -  Severe focal stenosis within the mid to distal A2 left anterior cerebral artery. Severe stenosis within a distal M2 left MCA vessel.   CT Perfusion - not ordered  Carotid Doppler - CTA neck ordered - carotid dopplers not indicated.  2D Echo - pending  14/11/2010 Virus 2  - negative  LDL - 182  HgbA1c - 4.7  UDS - not ordered  VTE prophylaxis - SCDs Diet  Diet Order            Diet NPO time specified  Diet effective now                 No antithrombotic prior to admission, now on No antithrombotic s/p  tPA  Patient will be counseled to be compliant with her antithrombotic medications  Ongoing aggressive stroke risk factor management  Therapy recommendations:  pending  Disposition:  Pending  Hypertension  Home BP meds: Cozaar and Inderal  Current BP meds: labetalol prn ; Cleviprex prn  BP somewhat low at times . Permissive hypertension (OK if < 220/120) but gradually normalize in 5-7 days  . Long-term BP goal normotensive  Hyperlipidemia  Home Lipid lowering medication: none   LDL 182, goal < 70  Current lipid lowering medication: none / NPO   Continue statin at discharge  Other Stroke Risk Factors  Advanced age  Chronic lacunar infarct within the right thalamocapsular junction by imaging  Other Active Problems  Code status -  Full code  NPO  Hypokalemia - potassium - 3.3 - supplement CKD - stage 3a - creatinine - 1.20  Hospital day # 1  Patient presented with embolic left anterior cerebral artery infarct and was treated with IV TPA with only partial improvement of symptoms so far.  Continue close neurological monitoring and strict blood pressure control as per post TPA protocol.  Patient had transient neurological worsening this afternoon which improved after she lay flat and was given IV normal saline bolus.  Plan to check EEG Recommend start aspirin 81 mg daily and she has passed a swallow eval.  She will need loop recorder to look for paroxysmal A. fib at discharge.  Follow echocardiogram results.  PT OT consults.  Long discussion with patient and answered questions about her care This patient is critically ill and at significant risk of neurological worsening, death and care requires constant monitoring of vital signs, hemodynamics,respiratory and cardiac monitoring, extensive review of multiple databases, frequent neurological assessment, discussion with family, other specialists and medical decision making of high complexity.I have made any additions or  clarifications directly to the above note.This critical care time does not reflect procedure time, or teaching time or supervisory time of PA/NP/Med Resident etc but could involve care discussion time.  I spent 30 minutes of neurocritical care time  in the care of  this patient.    Delia Heady, MD  To contact Stroke Continuity provider, please refer to [email or web address]. After hours, contact General Neurology

## 2020-05-10 NOTE — Evaluation (Signed)
Speech Language Pathology Evaluation Patient Details Name: Shelby Keller MRN: [id number] DOB: 06/19/1945 Today's Date: 05/10/2020 Time: 9371-6967 SLP Time Calculation (min) (ACUTE ONLY): 20 min  Problem List:  Patient Active Problem List   Diagnosis Date Noted  . Acute ischemic stroke (HCC) 05/09/2020   Past Medical History: History reviewed. No pertinent past medical history. Past Surgical History:  Past Surgical History:  Procedure Laterality Date  . BREAST CYST ASPIRATION Right    HPI:  Patient is a 74 y.o. female with PMH: degenerative disc disease, thyroid disease who presented to ER for sudden onset right sided weakness and word finding difficulty.  MRI revealed incomplete acute infarct left ACA territory. Patient had change in neuro status on 12/4 and was given a 500 NS bolus and put on bed rest for rest of the day.   Assessment / Plan / Recommendation Clinical Impression  Patient presents with a mild-mod cognitive impairment and moderate mixed receptive-expressive aphasia. Patient oriented x4, able to follow basic one and two step directions, completed confrontational naming, categorical naming without significant difficulty. She exhibited slow cognitive processing overall, odd affect,inconsistent initiation of responses. Responses were at word to a few words phrase level with abrupt pauses, halting speech. When describing cookie theft picture she would describe events "water overflowing", "washing dishes" (basically noun+verb). Her right arm appeared flaccid initially, but when asked to lift her arm up she was able to and then she was able to squeeze SLP's hand with very strong grip. She would not let go, however despite verbal cues and SLP had to remove fingers from her hand. She also kept right hand clenched in a fist but was able to spread out fingers. (question apraxia). She exhibited mild dysarthria and with intermittent phonemic paraphasias. Patient will benefit from skilled SLP  intervention and anticipate benefit from inpatient rehab SLP intervention.     SLP Assessment  SLP Recommendation/Assessment: Patient needs continued Speech Lanaguage Pathology Services SLP Visit Diagnosis: Aphasia (R47.01);Cognitive communication deficit (R41.841)    Follow Up Recommendations  Inpatient Rehab;Outpatient SLP;24 hour supervision/assistance    Frequency and Duration min 2x/week  2 weeks      SLP Evaluation Cognition  Overall Cognitive Status: Impaired/Different from baseline Arousal/Alertness: Awake/alert Orientation Level: Oriented to person;Oriented to place;Oriented to time;Oriented to situation Attention: Sustained Sustained Attention: Impaired Sustained Attention Impairment: Verbal basic Memory:  (difficult to determine secondary to patient's cognitive processing delays) Awareness: Impaired Awareness Impairment: Intellectual impairment;Emergent impairment Problem Solving: Impaired Problem Solving Impairment: Verbal basic;Functional basic Executive Function: Initiating Initiating: Impaired Initiating Impairment: Verbal basic;Functional basic Safety/Judgment: Impaired       Comprehension  Auditory Comprehension Overall Auditory Comprehension: Impaired Yes/No Questions: Impaired Basic Biographical Questions: 76-100% accurate Basic Immediate Environment Questions: 25-49% accurate Commands: Impaired One Step Basic Commands: 75-100% accurate Two Step Basic Commands: 50-74% accurate Conversation: Other (comment) (non fluent, pauses, halting speech, speaks in short phrases) Interfering Components: Attention;Processing speed EffectiveTechniques: Extra processing time;Repetition Reading Comprehension Reading Status: Impaired Word level: Within functional limits Sentence Level: Not tested Paragraph Level: Not tested Functional Environmental (signs, name badge): Impaired Interfering Components: Attention;Processing time Effective Techniques: Visual cueing     Expression Expression Primary Mode of Expression: Verbal Verbal Expression Overall Verbal Expression: Impaired Initiation: Impaired Level of Generative/Spontaneous Verbalization: Word Repetition: Impaired Level of Impairment: Sentence level Naming: No impairment Pragmatics: Impairment Impairments: Abnormal affect;Eye contact Interfering Components: Attention Effective Techniques: Semantic cues;Open ended questions Non-Verbal Means of Communication: Not applicable Written Expression Written Expression: Not tested   Oral /  Motor  Oral Motor/Sensory Function Overall Oral Motor/Sensory Function: Mild impairment Facial ROM: Reduced right Facial Symmetry: Abnormal symmetry right Lingual ROM: Within Functional Limits Lingual Symmetry: Within Functional Limits Lingual Strength: Reduced Motor Speech Overall Motor Speech: Impaired Respiration: Within functional limits Resonance: Within functional limits Articulation: Impaired Level of Impairment: Phrase Intelligibility: Intelligibility reduced Word: 75-100% accurate Phrase: 75-100% accurate Sentence: 75-100% accurate Conversation: 75-100% accurate Motor Planning: Witnin functional limits Motor Speech Errors: Not applicable Effective Techniques: Slow rate   GO                    Angela Nevin, MA, CCC-SLP Speech Therapy [facility] Acute Rehab

## 2020-05-11 ENCOUNTER — Inpatient Hospital Stay (HOSPITAL_COMMUNITY): Payer: Medicare Other

## 2020-05-11 DIAGNOSIS — I639 Cerebral infarction, unspecified: Secondary | ICD-10-CM | POA: Diagnosis not present

## 2020-05-11 LAB — CBC
HCT: 30.9 % — ABNORMAL LOW (ref 36.0–46.0)
Hemoglobin: 10.6 g/dL — ABNORMAL LOW (ref 12.0–15.0)
MCH: 30.9 pg (ref 26.0–34.0)
MCHC: 34.3 g/dL (ref 30.0–36.0)
MCV: 90.1 fL (ref 80.0–100.0)
Platelets: 144 10*3/uL — ABNORMAL LOW (ref 150–400)
RBC: 3.43 MIL/uL — ABNORMAL LOW (ref 3.87–5.11)
RDW: 12 % (ref 11.5–15.5)
WBC: 5 10*3/uL (ref 4.0–10.5)
nRBC: 0 % (ref 0.0–0.2)

## 2020-05-11 LAB — BASIC METABOLIC PANEL
Anion gap: 9 (ref 5–15)
BUN: 7 mg/dL — ABNORMAL LOW (ref 8–23)
CO2: 22 mmol/L (ref 22–32)
Calcium: 8.3 mg/dL — ABNORMAL LOW (ref 8.9–10.3)
Chloride: 111 mmol/L (ref 98–111)
Creatinine, Ser: 1.08 mg/dL — ABNORMAL HIGH (ref 0.44–1.00)
GFR, Estimated: 54 mL/min — ABNORMAL LOW (ref >60)
Glucose, Bld: 91 mg/dL (ref 70–99)
Potassium: 3.1 mmol/L — ABNORMAL LOW (ref 3.5–5.1)
Sodium: 142 mmol/L (ref 135–145)

## 2020-05-11 MED ORDER — POTASSIUM CHLORIDE CRYS ER 20 MEQ PO TBCR
20.0000 meq | EXTENDED_RELEASE_TABLET | Freq: Three times a day (TID) | ORAL | Status: AC
  Administered 2020-05-11 – 2020-05-12 (×3): 20 meq via ORAL
  Filled 2020-05-11 (×3): qty 1

## 2020-05-11 NOTE — Progress Notes (Signed)
EEG complete - results pending 

## 2020-05-11 NOTE — Procedures (Signed)
Patient Name: Shelby Keller  MRN: [id number]  Epilepsy Attending: Charlsie Quest  Referring Physician/Provider: Dr Delia Heady Date: 05/11/2020 Duration: 24.13 mins  Patient history: 74 y.o. female presented to the emergency room for sudden onset right sided weakness and word finding difficulty. EEG to evaluate for seizure.  Level of alertness: Awake  AEDs during EEG study: None  Technical aspects: This EEG study was done with scalp electrodes positioned according to the 10-20 International system of electrode placement. Electrical activity was acquired at a sampling rate of 500Hz  and reviewed with a high frequency filter of 70Hz  and a low frequency filter of 1Hz . EEG data were recorded continuously and digitally stored.   Description: The posterior dominant rhythm consists of 10-11 Hz activity of moderate voltage (25-35 uV) seen predominantly in posterior head regions, symmetric and reactive to eye opening and eye closing. Hyperventilation and photic stimulation were not performed.     IMPRESSION: This study is within normal limits. No seizures or epileptiform discharges were seen throughout the recording.  Brandy Kabat 

## 2020-05-11 NOTE — Evaluation (Signed)
Occupational Therapy Evaluation Patient Details Name: Shelby Keller MRN: [id number] DOB: 1946-01-10 Today's Date: 05/11/2020    History of Present Illness The pt is a 74 yo female presenting with acute R-sided weakness and word finding difficulty. Work up revealed acute infarct left ACA, chronic lacunar infarct.    Clinical Impression   This 74 y/o female presents with the above. PTA pt reports being independent with ADL, iADL and functional mobility. Pt very pleasant and willing to participate in therapy session. Pt currently presenting with the above and below listed deficits including R side weakness, RLE extensor tone, decreased standing balance, and communication difficulties. Pt requiring overall minA for functional transfers (via HHA) and taking few steps to/from EOB and along EOB. She requires up to maxA for LB ADL, minA for seated UB ADL. VSS throughout. Pt to benefit from continued acute OT services and currently feel she is an excellent candidate for CIR level therapies at time of discharge to maximize her overall safety and independence with ADL and mobility.     Follow Up Recommendations  CIR    Equipment Recommendations  Tub/shower seat;Other (comment) (TBD)    Recommendations for Other Services Rehab consult     Precautions / Restrictions Precautions Precautions: Fall Restrictions Weight Bearing Restrictions: No      Mobility Bed Mobility Overal bed mobility: Needs Assistance Bed Mobility: Supine to Sit;Sit to Supine     Supine to sit: Min assist Sit to supine: Min assist   General bed mobility comments: pt requiring assist for managing RLE to/from EOB    Transfers Overall transfer level: Needs assistance Equipment used: 1 person hand held assist Transfers: Sit to/from Stand Sit to Stand: Min assist Stand pivot transfers: Min assist       General transfer comment: steadying assist throughout without AD    Balance Overall balance assessment: Needs  assistance Sitting-balance support: No upper extremity supported;Feet supported Sitting balance-Leahy Scale: Good     Standing balance support: Single extremity supported;Bilateral upper extremity supported Standing balance-Leahy Scale: Poor Standing balance comment: reliant on UE support or external assist                            ADL either performed or assessed with clinical judgement   ADL Overall ADL's : Needs assistance/impaired Eating/Feeding: Modified independent;Sitting Eating/Feeding Details (indicate cue type and reason): pt's lunch arriving end of session; pt requiring increased time/effort to open packages, etc, using LUE > RUE during task  Grooming: Minimal assistance;Sitting   Upper Body Bathing: Minimal assistance;Sitting   Lower Body Bathing: Moderate assistance;Sit to/from stand   Upper Body Dressing : Moderate assistance   Lower Body Dressing: Maximal assistance;Sit to/from stand Lower Body Dressing Details (indicate cue type and reason): minA static standing balance  Toilet Transfer: Minimal assistance;Stand-pivot Toilet Transfer Details (indicate cue type and reason): simulated via transfer to/from EOB  Toileting- Clothing Manipulation and Hygiene: Moderate assistance;Sit to/from stand       Functional mobility during ADLs: Minimal assistance (via HHA )       Vision Baseline Vision/History: Wears glasses Wears Glasses: At all times Patient Visual Report: Other (comment) (reports getting a cataract (not CVA related) no other change)       Perception     Praxis      Pertinent Vitals/Pain Pain Assessment: No/denies pain     Hand Dominance Right   Extremity/Trunk Assessment Upper Extremity Assessment Upper Extremity Assessment: RUE deficits/detail RUE Deficits /  Details: grossly 3+/5 throughout, good grip strength, pt endorses decreased fine motor control with use of RUE during functional tasks  RUE Sensation: WNL RUE Coordination:  decreased gross motor;decreased fine motor   Lower Extremity Assessment Lower Extremity Assessment: Defer to PT evaluation RLE Deficits / Details: significant extensor tone in RLE, limited DF ROM due to tone, pt unable to actively flex knee at this time. Knee extensor strength 4+/5, PF 4+/5 RLE Coordination: decreased gross motor   Cervical / Trunk Assessment Cervical / Trunk Assessment: Normal   Communication Communication Communication: Expressive difficulties   Cognition Arousal/Alertness: Awake/alert Behavior During Therapy: WFL for tasks assessed/performed Overall Cognitive Status: Impaired/Different from baseline Area of Impairment: Problem solving                             Problem Solving: Slow processing General Comments: delayed response time however pt aware of communication deficits    General Comments  VSS on RA     Exercises     Shoulder Instructions      Home Living Family/patient expects to be discharged to:: Private residence Living Arrangements: Children;Other relatives Available Help at Discharge: Family;Available 24 hours/day (pt's brother 24/7, dtr and SIL work days) Type of Home: House Home Access: Stairs to enter [employer] of Steps: 4 Entrance Stairs-Rails: None Home Layout: Two level;Able to live on main level with bedroom/bathroom     Bathroom Shower/Tub: Chief Strategy Officer: Standard     Home Equipment: Cane - single point          Prior Functioning/Environment Level of Independence: Independent                 OT Problem List: Decreased strength;Decreased range of motion;Decreased activity tolerance;Impaired balance (sitting and/or standing);Decreased coordination;Decreased cognition;Decreased safety awareness;Impaired UE functional use;Impaired tone      OT Treatment/Interventions: Self-care/ADL training;Therapeutic exercise;Energy conservation;DME and/or AE instruction;Therapeutic  activities;Cognitive remediation/compensation;Patient/family education;Balance training;Visual/perceptual remediation/compensation    OT Goals(Current goals can be found in the care plan section) Acute Rehab OT Goals Patient Stated Goal: To return to independent mobility OT Goal Formulation: With patient Time For Goal Achievement: 05/25/20 Potential to Achieve Goals: Good  OT Frequency: Min 2X/week   Barriers to D/C:            Co-evaluation              AM-PAC OT "6 Clicks" Daily Activity     Outcome Measure Help from another person eating meals?: None Help from another person taking care of personal grooming?: A Little Help from another person toileting, which includes using toliet, bedpan, or urinal?: A Little Help from another person bathing (including washing, rinsing, drying)?: A Lot Help from another person to put on and taking off regular upper body clothing?: A Little Help from another person to put on and taking off regular lower body clothing?: A Lot 6 Click Score: 17   End of Session Equipment Utilized During Treatment: Gait belt Nurse Communication: Mobility status  Activity Tolerance: Patient tolerated treatment well Patient left: in bed;with call bell/phone within reach;with bed alarm set  OT Visit Diagnosis: Hemiplegia and hemiparesis;Other symptoms and signs involving the nervous system (R29.898);Other abnormalities of gait and mobility (R26.89) Hemiplegia - Right/Left: Right Hemiplegia - dominant/non-dominant: Dominant Hemiplegia - caused by: Cerebral infarction                Time: 4270-6237 OT Time Calculation (min): 20 min Charges:  OT General Charges $OT Visit: 1 Visit OT Evaluation $OT Eval Moderate Complexity: 1 Mod  Marcy Siren, OT Acute Rehabilitation Services Pager [phone] Office [phone]   Shelby Keller 05/11/2020, 2:01 PM

## 2020-05-11 NOTE — Progress Notes (Signed)
STROKE TEAM PROGRESS NOTE   INTERVAL HISTORY Patient is lying in bed comfortably.  She states she is able to move the right leg more today.  She has no new complaints.  Echocardiogram shows ejection fraction 60 to 75%.  LDL cholesterol is 182 mg percent.  Hemoglobin A1c is 4.7. Vital signs are stable.  Blood pressure adequately controlled.  OBJECTIVE Vitals:   05/11/20 0016 05/11/20 0344 05/11/20 0719 05/11/20 1111  BP: (!) 144/70 (!) 162/80 (!) 161/84 (!) 170/80  Pulse: 75 70    Resp: 16 15 18 15   Temp: 98.1 F (36.7 C) 98.1 F (36.7 C) 98.2 F (36.8 C) 97.6 F (36.4 C)  TempSrc: Oral Oral Oral Oral  SpO2: 97% 94% 97% 97%  Weight:      Height:        CBC:  Recent Labs  Lab 05/09/20 1830 05/09/20 1830 05/09/20 1832 05/11/20 0124  WBC 7.3  --   --  5.0  NEUTROABS 5.2  --   --   --   HGB 13.4   < > 12.6 10.6*  HCT 40.5   < > 37.0 30.9*  MCV 93.5  --   --  90.1  PLT 194  --   --  144*   < > = values in this interval not displayed.    Basic Metabolic Panel:  Recent Labs  Lab 05/09/20 1830 05/09/20 1830 05/09/20 1832 05/11/20 0124  NA 140   < > 140 142  K 3.2*   < > 3.3* 3.1*  CL 105   < > 105 111  CO2 24  --   --  22  GLUCOSE 124*   < > 117* 91  BUN 13   < > 16 7*  CREATININE 1.23*   < > 1.20* 1.08*  CALCIUM 9.3  --   --  8.3*   < > = values in this interval not displayed.    Lipid Panel:     Component Value Date/Time   CHOL 246 (H) 05/10/2020 0103   TRIG 88 05/10/2020 0103   HDL 46 05/10/2020 0103   CHOLHDL 5.3 05/10/2020 0103   VLDL 18 05/10/2020 0103   LDLCALC 182 (H) 05/10/2020 0103   HgbA1c:  Lab Results  Component Value Date   HGBA1C 4.7 (L) 05/10/2020   Urine Drug Screen: No results found for: LABOPIA, COCAINSCRNUR, LABBENZ, AMPHETMU, THCU, LABBARB  Alcohol Level No results found for: [facility]  IMAGING  CT Code Stroke CTA Head W/WO contrast CT Code Stroke CTA Neck W/WO contrast 05/09/2020 IMPRESSION:   CTA neck:  The common carotid,  internal carotid and vertebral arteries are patent within the neck without hemodynamically significant stenosis. Atherosclerotic plaque within the carotid systems as described.   CTA head:  1. Severe focal stenosis within the mid to distal A2 left anterior cerebral artery.  2. Severe stenosis within a distal M2 left MCA vessel.   CT HEAD CODE STROKE WO CONTRAST 05/09/2020 IMPRESSION:  No evidence of acute intracranial abnormality. Mild cerebral atrophy with moderate chronic small vessel ischemic disease, progressed as compared to the brain MRI of 05/13/2011. 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   MRI BRAIN WO CONTRAST - patchy left anterior cerebral artery acute infarct.  No hemorrhage  05/10/2020 IMPRESSION: Incomplete acute infarct left ACA territory. Negative for hemorrhage Moderate chronic microvascular ischemic change.   Transthoracic Echocardiogram  05/10/2020 IMPRESSIONS  1. Left ventricular ejection fraction, by estimation, is 65 to 70%. The  left ventricle has normal function. The left ventricle has no regional  wall motion abnormalities. There is mild left ventricular hypertrophy.  Left ventricular diastolic parameters  were normal.  2. Right ventricular systolic function is normal. The right ventricular  size is normal. Tricuspid regurgitation signal is inadequate for assessing  PA pressure.  3. The mitral valve is normal in structure. No evidence of mitral valve  regurgitation.  4. The aortic valve is tricuspid. Aortic valve regurgitation is not  visualized. No aortic stenosis is present.  5. The inferior vena cava is normal in size with greater than 50%  respiratory variability, suggesting right atrial pressure of 3 mmHg.   EEG - pending 05/10/2020   ECG - SR rate 69 BPM. (See cardiology reading for complete details)  PHYSICAL EXAM   Blood pressure (!) 170/80, pulse 70, temperature 97.6 F (36.4 C), temperature  source Oral, resp. rate 15, height 5\' 4"  (1.626 m), weight 53 kg, SpO2 97 %. Pleasant elderly Caucasian lady not in distress. . Afebrile. Head is nontraumatic. Neck is supple without bruit.    Cardiac exam no murmur or gallop. Lungs are clear to auscultation. Distal pulses are well felt. Neurological Exam ;  Awake alert oriented to time place and person.  Speech is slightly hesitant and nonfluent with occasional word finding difficulties but no dysarthria.  Follows commands well.  Extraocular movements are full range without nystagmus.  Slight right lower facial asymmetry.  Tongue midline.  No upper extremity drift but slight diminished fine finger movements on the right and orbits left over right upper extremity.  Right lower extremity drift with 2-3/5 strength with slight increased tone.  Sensation intact bilaterally.  Plantars downgoing.     ASSESSMENT/PLAN [name] is a 74 y.o. female with history of degenerative disc disease and thyroid disease, presents to the emergency room for sudden onset right sided weakness and word finding difficulty. The patient received IV t-PA Friday 05/09/20 at 1930  Stroke like syndrome: MRI pending  Resultant right leg weakness and mild aphasia   code Stroke CT Head - No evidence of acute intracranial abnormality. Mild cerebral atrophy with moderate chronic small vessel ischemic disease, progressed as compared to the brain MRI of 05/13/2011. 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   CT head - not ordered  MRI head - patchy left ACA territory infarct.  No hemorrhage.  MRA head - not ordered  CTA H&N -  Severe focal stenosis within the mid to distal A2 left anterior cerebral artery. Severe stenosis within a distal M2 left MCA vessel.   CT Perfusion - not ordered  Carotid Doppler - CTA neck ordered - carotid dopplers not indicated.  EEG - pending  2D Echo - EF 65 to 70%. No cardiac source of emboli  identified.   Sars Corona Virus 2  - negative  LDL - 182  HgbA1c - 4.7  UDS - not ordered  VTE prophylaxis - SCDs Diet  Diet Order            Diet Heart Room service appropriate? Yes with Assist; Fluid consistency: Thin  Diet effective now                 No antithrombotic prior to admission, now on aspirin 81 mg daily   Patient will be counseled to be compliant with her antithrombotic medications  Ongoing aggressive stroke risk factor management  Therapy recommendations:  CIR recommended  Disposition:  Pending  Hypertension  Home BP meds: Cozaar and Inderal  Current BP meds: labetalol prn ; Cleviprex prn  BP somewhat low at times . Permissive hypertension (OK if < 220/120) but gradually normalize in 5-7 days  . Long-term BP goal normotensive  Hyperlipidemia  Home Lipid lowering medication: none   LDL 182, goal < 70  Current lipid lowering medication: none / NPO   Continue statin at discharge  Other Stroke Risk Factors  Advanced age  Chronic lacunar infarct within the right thalamocapsular junction by imaging  Other Active Problems  Code status - Full code  NPO   Hypokalemia - potassium - 3.2 ->3.3 - supplement - 3.1 - supplement and recheck  CKD - stage 3a - creatinine - 1.20->1.08  Anemia - Hgb - 13.4->12.6->10.6   Mild thrombocytopenia - platelets - 194->144  Loop implant recommended by Dr [name]  Recurrent neurologic symptoms 12/4 - improved with saline bolus and lying flat.   Hospital day # 2 Continue ongoing therapies.  Mobilize out of bed.  Likely transfer to inpatient rehab in the next few days when bed available.  Replace potassium she will need loop recorder at the time of discharge.  Long discussion patient and answered questions.  Greater than 50% time during this 25-minute visit was spent in counseling and coordination of care and discussion with care team Delia Heady, MD      To contact Stroke Continuity provider, please  refer to [email or web address]. After hours, contact General Neurology

## 2020-05-11 NOTE — Evaluation (Signed)
Physical Therapy Evaluation Patient Details Name: Shelby Keller MRN: [id number] DOB: 13-Dec-1945 Today's Date: 05/11/2020   History of Present Illness  The pt is a 74 yo female presenting with acute R-sided weakness and word finding difficulty. Work up revealed acute infarct left ACA, chronic lacunar infarct.   Clinical Impression  Pt presents to PT with deficits in functional mobility, gait, balance, endurance, strength, power, motor control, tone. Pt with significant increase in tone in RLE and with impaired motor control of RUE at times. Pt with R foot drag with ambulation due to increased tone, and requires increased time to ambulate and perform turns. Pt will benefit from further assessment of dynamic gait and balance activities during the next session. PT recommends CIR placement at this time as the pt was independent prior to admission and demonstrates the potential to return to a modI level with high intensity inpatient PT services.    Follow Up Recommendations CIR;Supervision/Assistance - 24 hour (HHPT if electing to D/C home)    Equipment Recommendations  Rolling walker with 5" wheels    Recommendations for Other Services Rehab consult     Precautions / Restrictions Precautions Precautions: Fall Restrictions Weight Bearing Restrictions: No      Mobility  Bed Mobility Overal bed mobility: Needs Assistance Bed Mobility: Supine to Sit     Supine to sit: Min guard     General bed mobility comments: increased time, pt utilizes UEs to move RLE off bed    Transfers Overall transfer level: Needs assistance Equipment used: Rolling walker (2 wheeled);None Transfers: Sit to/from [employer] to Stand: Min guard Stand pivot transfers: Min assist       General transfer comment: minA to stand pivot without device  Ambulation/Gait Ambulation/Gait assistance: Min assist Gait Distance (Feet): 20 Feet Assistive device: Rolling walker (2 wheeled) Gait  Pattern/deviations: Step-to pattern;Decreased dorsiflexion - right Gait velocity: reduced Gait velocity interpretation: <1.31 ft/sec, indicative of household ambulator General Gait Details: pt with short step-to gait, intermittent R foot drag, increased time for wide turns  Information systems manager Rankin (Stroke Patients Only) Modified Rankin (Stroke Patients Only) Pre-Morbid Rankin Score: No symptoms Modified Rankin: Moderately severe disability     Balance Overall balance assessment: Needs assistance Sitting-balance support: No upper extremity supported;Feet supported Sitting balance-Leahy Scale: Good     Standing balance support: Single extremity supported;Bilateral upper extremity supported Standing balance-Leahy Scale: Poor Standing balance comment: reliant on UE support                             Pertinent Vitals/Pain Pain Assessment: No/denies pain    Home Living Family/patient expects to be discharged to:: Private residence Living Arrangements: Children;Other relatives Available Help at Discharge: Family;Available 24 hours/day (pt's brother 24/7, dtr and SIL work days) Type of Home: House Home Access: Stairs to enter Entrance Stairs-Rails: None Entrance Stairs-Number of Steps: 4 Home Layout: Two level;Able to live on main level with bedroom/bathroom Home Equipment: Gilmer Mor - single point      Prior Function Level of Independence: Independent               Hand Dominance   Dominant Hand: Right    Extremity/Trunk Assessment   Upper Extremity Assessment Upper Extremity Assessment: RUE deficits/detail RUE Deficits / Details: grossly 4/5 RUE Coordination: decreased gross motor    Lower Extremity Assessment Lower  Extremity Assessment: RLE deficits/detail RLE Deficits / Details: significant extensor tone in RLE, limited DF ROM due to tone, pt unable to actively flex knee at this time. Knee extensor strength  4+/5, PF 4+/5 RLE Coordination: decreased gross motor    Cervical / Trunk Assessment Cervical / Trunk Assessment: Normal  Communication   Communication: Expressive difficulties  Cognition Arousal/Alertness: Awake/alert Behavior During Therapy: WFL for tasks assessed/performed Overall Cognitive Status: Impaired/Different from baseline Area of Impairment: Problem solving                             Problem Solving: Slow processing        General Comments General comments (skin integrity, edema, etc.): VSS on RA    Exercises     Assessment/Plan    PT Assessment Patient needs continued PT services  PT Problem List Decreased strength;Decreased activity tolerance;Decreased balance;Decreased mobility;Decreased coordination;Decreased knowledge of use of DME;Impaired tone       PT Treatment Interventions DME instruction;Gait training;Stair training;Functional mobility training;Therapeutic activities;Therapeutic exercise;Balance training;Neuromuscular re-education;Patient/family education    PT Goals (Current goals can be found in the Care Plan section)  Acute Rehab PT Goals Patient Stated Goal: To return to independent mobility PT Goal Formulation: With patient Time For Goal Achievement: 05/25/20 Potential to Achieve Goals: Good    Frequency Min 4X/week   Barriers to discharge        Co-evaluation               AM-PAC PT "6 Clicks" Mobility  Outcome Measure Help needed turning from your back to your side while in a flat bed without using bedrails?: A Little Help needed moving from lying on your back to sitting on the side of a flat bed without using bedrails?: A Little Help needed moving to and from a bed to a chair (including a wheelchair)?: A Little Help needed standing up from a chair using your arms (e.g., wheelchair or bedside chair)?: A Little Help needed to walk in hospital room?: A Little Help needed climbing 3-5 steps with a railing? : A Lot 6  Click Score: 17    End of Session   Activity Tolerance: Patient tolerated treatment well Patient left: in chair;with call bell/phone within reach;with chair alarm set Nurse Communication: Mobility status PT Visit Diagnosis: Unsteadiness on feet (R26.81);Other abnormalities of gait and mobility (R26.89);Other symptoms and signs involving the nervous system (R29.898)    Time: 6213-0865 PT Time Calculation (min) (ACUTE ONLY): 31 min   Charges:   PT Evaluation $PT Eval Moderate Complexity: 1 Mod PT Treatments $Gait Training: 8-22 mins        Arlyss Gandy, PT, DPT Acute Rehabilitation Pager: [phone]   Arlyss Gandy 05/11/2020, 11:07 AM

## 2020-05-12 ENCOUNTER — Encounter (HOSPITAL_COMMUNITY)
Admission: EM | Disposition: A | Discharge status: Home Health | Payer: Self-pay | Source: Home / Self Care | Attending: Neurology

## 2020-05-12 DIAGNOSIS — D696 Thrombocytopenia, unspecified: Secondary | ICD-10-CM

## 2020-05-12 DIAGNOSIS — E876 Hypokalemia: Secondary | ICD-10-CM | POA: Diagnosis present

## 2020-05-12 DIAGNOSIS — E785 Hyperlipidemia, unspecified: Secondary | ICD-10-CM

## 2020-05-12 DIAGNOSIS — I10 Essential (primary) hypertension: Secondary | ICD-10-CM

## 2020-05-12 HISTORY — PX: LOOP RECORDER INSERTION: EP1214

## 2020-05-12 LAB — CBC
HCT: 32.8 % — ABNORMAL LOW (ref 36.0–46.0)
Hemoglobin: 11.4 g/dL — ABNORMAL LOW (ref 12.0–15.0)
MCH: 31.1 pg (ref 26.0–34.0)
MCHC: 34.8 g/dL (ref 30.0–36.0)
MCV: 89.4 fL (ref 80.0–100.0)
Platelets: 155 10*3/uL (ref 150–400)
RBC: 3.67 MIL/uL — ABNORMAL LOW (ref 3.87–5.11)
RDW: 12 % (ref 11.5–15.5)
WBC: 5.9 10*3/uL (ref 4.0–10.5)
nRBC: 0 % (ref 0.0–0.2)

## 2020-05-12 LAB — BASIC METABOLIC PANEL
Anion gap: 10 (ref 5–15)
BUN: <5 mg/dL — ABNORMAL LOW (ref 8–23)
CO2: 23 mmol/L (ref 22–32)
Calcium: 8.9 mg/dL (ref 8.9–10.3)
Chloride: 106 mmol/L (ref 98–111)
Creatinine, Ser: 0.93 mg/dL (ref 0.44–1.00)
GFR, Estimated: >60 mL/min (ref >60)
Glucose, Bld: 95 mg/dL (ref 70–99)
Potassium: 3.2 mmol/L — ABNORMAL LOW (ref 3.5–5.1)
Sodium: 139 mmol/L (ref 135–145)

## 2020-05-12 LAB — MAGNESIUM: Magnesium: 1.6 mg/dL — ABNORMAL LOW (ref 1.7–2.4)

## 2020-05-12 SURGERY — LOOP RECORDER INSERTION
Anesthesia: LOCAL

## 2020-05-12 MED ORDER — PAROXETINE HCL 20 MG PO TABS
40.0000 mg | ORAL_TABLET | Freq: Every day | ORAL | Status: DC
  Administered 2020-05-12: 40 mg via ORAL
  Filled 2020-05-12: qty 2

## 2020-05-12 MED ORDER — POTASSIUM CHLORIDE 20 MEQ PO PACK
40.0000 meq | PACK | ORAL | Status: DC
  Administered 2020-05-12: 40 meq via ORAL
  Filled 2020-05-12: qty 2

## 2020-05-12 MED ORDER — MAGNESIUM SULFATE 2 GM/50ML IV SOLN
2.0000 g | Freq: Once | INTRAVENOUS | Status: AC
  Administered 2020-05-12: 2 g via INTRAVENOUS
  Filled 2020-05-12: qty 50

## 2020-05-12 MED ORDER — LIDOCAINE-EPINEPHRINE 1 %-1:100000 IJ SOLN
INTRAMUSCULAR | Status: AC
  Filled 2020-05-12: qty 1

## 2020-05-12 MED ORDER — PANTOPRAZOLE SODIUM 40 MG PO TBEC: 40.0000 mg | DELAYED_RELEASE_TABLET | Freq: Every day | ORAL | Status: DC | PRN

## 2020-05-12 MED ORDER — PROPRANOLOL HCL 20 MG PO TABS
20.0000 mg | ORAL_TABLET | Freq: Two times a day (BID) | ORAL | Status: DC
  Administered 2020-05-12: 20 mg via ORAL
  Filled 2020-05-12 (×3): qty 1

## 2020-05-12 MED ORDER — CLOPIDOGREL BISULFATE 75 MG PO TABS: 75.0000 mg | ORAL_TABLET | Freq: Every day | ORAL | 0 refills | Status: AC

## 2020-05-12 MED ORDER — POTASSIUM CHLORIDE CRYS ER 20 MEQ PO TBCR: 40.0000 meq | EXTENDED_RELEASE_TABLET | Freq: Once | ORAL | 0 refills | Status: DC

## 2020-05-12 MED ORDER — ATORVASTATIN CALCIUM 40 MG PO TABS: 40.0000 mg | ORAL_TABLET | Freq: Every day | ORAL | 2 refills | Status: AC

## 2020-05-12 MED ORDER — BUPIVACAINE HCL (PF) 0.25 % IJ SOLN
INTRAMUSCULAR | Status: DC | PRN
  Administered 2020-05-12: 10 mL

## 2020-05-12 MED ORDER — LEVOTHYROXINE SODIUM 75 MCG PO TABS
75.0000 ug | ORAL_TABLET | Freq: Every day | ORAL | Status: DC
  Administered 2020-05-12: 75 ug via ORAL
  Filled 2020-05-12: qty 1

## 2020-05-12 MED ORDER — ASPIRIN 81 MG PO TBEC: 81.0000 mg | DELAYED_RELEASE_TABLET | Freq: Every day | ORAL | 11 refills | Status: AC

## 2020-05-12 MED ORDER — LOSARTAN POTASSIUM 50 MG PO TABS
50.0000 mg | ORAL_TABLET | Freq: Every day | ORAL | Status: DC
  Administered 2020-05-12: 50 mg via ORAL
  Filled 2020-05-12: qty 1

## 2020-05-12 MED ORDER — ATORVASTATIN CALCIUM 40 MG PO TABS
40.0000 mg | ORAL_TABLET | Freq: Every day | ORAL | Status: DC
  Filled 2020-05-12: qty 1

## 2020-05-12 MED ORDER — CLOPIDOGREL BISULFATE 75 MG PO TABS
75.0000 mg | ORAL_TABLET | Freq: Every day | ORAL | Status: DC
  Administered 2020-05-12: 75 mg via ORAL
  Filled 2020-05-12: qty 1

## 2020-05-12 SURGICAL SUPPLY — 2 items
MONITOR REVEAL LINQ II (Prosthesis & Implant Heart) ×2 IMPLANT
PACK LOOP INSERTION (CUSTOM PROCEDURE TRAY) ×2 IMPLANT

## 2020-05-12 NOTE — Consult Note (Signed)
Physical Medicine and Rehabilitation Consult Reason for Consult: Right side weakness and word finding difficulty Referring Physician: Dr. [name]   HPI: Shelby Keller is a 74 y.o. right-handed female  With past history of degenerative disc disease, thyroid disease.  Per chart review patient lives with her family.  Independent prior to admission.  Two-level home bed and bath main level 4 steps to entry.  Patient's brother can provide assistance as well as a daughter and son-in-law who work Water engineer.  Presented 05/09/2020 with right side weakness and speech difficulty.  EEG negative for seizure.  Cranial CT scan showed no evidence of acute intracranial abnormality.  Patient did receive TPA.  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  Severe stenosis within a distal M2 left MCA vessel.  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  Echocardiogram with ejection fraction of 65 to 70% no wall motion Abnormalities.  Admission chemistries potassium 3.2 creatinine 1.23 glucose 124, hemoglobin 13.4, hemoglobin A1c 4.7.  Currently maintained on aspirin and Plavix for CVA prophylaxis x3 weeks and aspirin alone.  Awaiting plan for loop recorder.  Therapy evaluations completed with recommendations of physical medicine rehab consult.  Review of Systems  Constitutional: Negative for chills and fever.  HENT: Negative for hearing loss.   Eyes: Negative for blurred vision and double vision.  Respiratory: Negative for cough and shortness of breath.   Cardiovascular: Negative for chest pain, palpitations and leg swelling.  Gastrointestinal: Positive for constipation. Negative for heartburn, nausea and vomiting.  Genitourinary: Negative for dysuria and hematuria.  Musculoskeletal: Positive for back pain, joint pain and myalgias.  Skin: Negative for rash.  Neurological: Positive for speech change and weakness.    All other systems reviewed and are negative.  History reviewed. No pertinent past medical history. Past Surgical History:  Procedure Laterality Date  . BREAST CYST ASPIRATION Right    Family History  Problem Relation Age of Onset  . Breast cancer Neg Hx    Social History:  reports that she has never smoked. She has never used smokeless tobacco. She reports that she does not drink alcohol and does not use drugs. Allergies:  Allergies  Allergen Reactions  . Lisinopril Cough   Medications Prior to Admission  Medication Sig Dispense Refill  . doxylamine, Sleep, (UNISOM) 25 MG tablet Take 25 mg by mouth at bedtime as needed for sleep.    Marland Kitchen levothyroxine (SYNTHROID) 75 MCG tablet Take 75 mcg by mouth daily.     Marland Kitchen losartan (COZAAR) 50 MG tablet Take 50 mg by mouth daily.    Marland Kitchen oxymetazoline (AFRIN) 0.05 % nasal spray Place 1 spray into both nostrils as needed for congestion.    . pantoprazole (PROTONIX) 40 MG tablet Take 40 mg by mouth daily as needed (acid reflux).     Marland Kitchen PARoxetine (PAXIL) 40 MG tablet Take 40 mg by mouth daily.    . propranolol (INDERAL) 10 MG tablet Take 20 mg by mouth 2 (two) times daily.       Home: Home Living Family/patient expects to be discharged to:: Private residence Living Arrangements: Children, Other relatives Available Help at Discharge: Family, Available 24 hours/day (pt's brother 24/7, dtr and SIL work days) Type of Home: House Home Access: Stairs to enter Secretary/administrator of Steps: 4 Entrance Stairs-Rails: None Home Layout: Two level, Able to live on main level with bedroom/bathroom Bathroom Shower/Tub: Tub/shower  unit Bathroom Toilet: Standard Home Equipment: Cane - single point  Functional History: Prior Function Level of Independence: Independent Functional Status:  Mobility: Bed Mobility Overal bed mobility: Needs Assistance Bed Mobility: Supine to Sit Supine to sit: Supervision Sit to supine: Min assist General bed mobility  comments: pt brought self to long sit, increased time, used hands to move R LE off EOB, hob elevated Transfers Overall transfer level: Needs assistance Equipment used: Rolling walker (2 wheeled) Transfers: Sit to/from Stand Sit to Stand: Min guard Stand pivot transfers: Min assist General transfer comment: verbal cues for hand placement, increased time, min guard for safety Ambulation/Gait Ambulation/Gait assistance: Min guard Gait Distance (Feet): 150 Feet Assistive device: Rolling walker (2 wheeled) Gait Pattern/deviations: Step-through pattern, Decreased stride length General Gait Details: pt with decreased step length, increased time, pt with occasional running into things on the L, directional verbal cues, Gait velocity: decreased Gait velocity interpretation: <1.31 ft/sec, indicative of household ambulator    ADL: ADL Overall ADL's : Needs assistance/impaired Eating/Feeding: Modified independent, Sitting Eating/Feeding Details (indicate cue type and reason): pt's lunch arriving end of session; pt requiring increased time/effort to open packages, etc, using LUE > RUE during task  Grooming: Minimal assistance, Sitting Upper Body Bathing: Minimal assistance, Sitting Lower Body Bathing: Moderate assistance, Sit to/from stand Upper Body Dressing : Moderate assistance Lower Body Dressing: Maximal assistance, Sit to/from stand Lower Body Dressing Details (indicate cue type and reason): minA static standing balance  Toilet Transfer: Minimal assistance, Stand-pivot Toilet Transfer Details (indicate cue type and reason): simulated via transfer to/from EOB  Toileting- Clothing Manipulation and Hygiene: Moderate assistance, Sit to/from stand Functional mobility during ADLs: Minimal assistance (via HHA )  Cognition: Cognition Overall Cognitive Status: Within Functional Limits for tasks assessed Arousal/Alertness: Awake/alert Orientation Level: Oriented X4 Attention:  Sustained Sustained Attention: Impaired Sustained Attention Impairment: Verbal basic Memory:  (difficult to determine secondary to patient's cognitive processing delays) Awareness: Impaired Awareness Impairment: Intellectual impairment, Emergent impairment Problem Solving: Impaired Problem Solving Impairment: Verbal basic, Functional basic Executive Function: Initiating Initiating: Impaired Initiating Impairment: Verbal basic, Functional basic Safety/Judgment: Impaired Cognition Arousal/Alertness: Awake/alert Behavior During Therapy: WFL for tasks assessed/performed Overall Cognitive Status: Within Functional Limits for tasks assessed Area of Impairment: Problem solving Problem Solving: Slow processing General Comments: pt very HOH that makes following commands a little delayed. Pt able to report "I will need a RW for home". Pt stated why she had a purwick and that she feels she can get to the bathroom if they helped her. spoke with RN tech and asked her to progress her to BSC/bathroom  Blood pressure (!) 135/96, pulse 100, temperature 97.9 F (36.6 C), temperature source Oral, resp. rate 18, height 5\' 4"  (1.626 m), weight 53 kg, SpO2 97 %. Physical Exam Constitutional:      Appearance: Normal appearance.  HENT:     Head: Normocephalic and atraumatic.     Mouth/Throat:     Mouth: Mucous membranes are moist.  Eyes:     Extraocular Movements: Extraocular movements intact.     Conjunctiva/sclera: Conjunctivae normal.     Pupils: Pupils are equal, round, and reactive to light.  Cardiovascular:     Rate and Rhythm: Normal rate and regular rhythm.     Heart sounds: Normal heart sounds.  Pulmonary:     Effort: Pulmonary effort is normal. No respiratory distress.     Breath sounds: Normal breath sounds. No stridor. No wheezing.  Abdominal:     General: Abdomen is flat.  Bowel sounds are normal. There is no distension.     Palpations: Abdomen is soft.     Tenderness: There is no  abdominal tenderness.  Musculoskeletal:        General: No swelling or tenderness. Normal range of motion.     Cervical back: Neck supple.     Right lower leg: No edema.     Left lower leg: No edema.  Skin:    General: Skin is warm and dry.     Findings: No bruising.  Neurological:     Mental Status: She is alert.     Sensory: No sensory deficit.     Comments: Patient is alert in no acute distress.  Makes eye contact with examiner follows simple commands.  She does have word finding difficulties.  Motor 5/5 in BUE 4/5 RLE, 5/5 LLE     Results for orders placed or performed during the hospital encounter of 05/09/20 (from the past 24 hour(s))  Basic metabolic panel     Status: Abnormal   Collection Time: 05/12/20  1:09 AM  Result Value Ref Range   Sodium 139 135 - 145 mmol/L   Potassium 3.2 (L) 3.5 - 5.1 mmol/L   Chloride 106 98 - 111 mmol/L   CO2 23 22 - 32 mmol/L   Glucose, Bld 95 70 - 99 mg/dL   BUN <5 (L) 8 - 23 mg/dL   Creatinine, Ser 9.51 0.44 - 1.00 mg/dL   Calcium 8.9 8.9 - 88.4 mg/dL   GFR, Estimated >16 >60 mL/min   Anion gap 10 5 - 15  CBC     Status: Abnormal   Collection Time: 05/12/20  1:09 AM  Result Value Ref Range   WBC 5.9 4.0 - 10.5 K/uL   RBC 3.67 (L) 3.87 - 5.11 MIL/uL   Hemoglobin 11.4 (L) 12.0 - 15.0 g/dL   HCT 63.0 (L) 36 - 46 %   MCV 89.4 80.0 - 100.0 fL   MCH 31.1 26.0 - 34.0 pg   MCHC 34.8 30.0 - 36.0 g/dL   RDW 16.0 10.9 - 32.3 %   Platelets 155 150 - 400 K/uL   nRBC 0.0 0.0 - 0.2 %   EEG adult  Result Date: 05/11/2020 Charlsie Quest, MD     05/11/2020  6:28 PM Patient Name: ANTONETTA CLANTON MRN: [id number] Epilepsy Attending: Charlsie Quest Referring Physician/Provider: Dr Delia Heady Date: 05/11/2020 Duration: 24.13 mins Patient history: 74 y.o. female presented to the emergency room for sudden onset right sided weakness and word finding difficulty. EEG to evaluate for seizure. Level of alertness: Awake AEDs during EEG study: None  Technical aspects: This EEG study was done with scalp electrodes positioned according to the 10-20 International system of electrode placement. Electrical activity was acquired at a sampling rate of 500Hz  and reviewed with a high frequency filter of 70Hz  and a low frequency filter of 1Hz . EEG data were recorded continuously and digitally stored. Description: The posterior dominant rhythm consists of 10-11 Hz activity of moderate voltage (25-35 uV) seen predominantly in posterior head regions, symmetric and reactive to eye opening and eye closing. Hyperventilation and photic stimulation were not performed.   IMPRESSION: This study is within normal limits. No seizures or epileptiform discharges were seen throughout the recording.   ECHOCARDIOGRAM COMPLETE  Result Date: 05/10/2020    ECHOCARDIOGRAM REPORT   Patient Name:   KADEY MIHALIC Date of Exam: 05/10/2020 Medical Rec #:  14/09/[id number]  Height:       64.0 in Accession #:    [id number]     Weight:       116.8 lb Date of Birth:  [birth year]      BSA:          1.557 m Patient Age:    74 years       BP:           141/106 mmHg Patient Gender: F              HR:           69 bpm. Exam Location:  Inpatient Procedure: 2D Echo Indications:    stroke 434.91  History:        Patient has no prior history of Echocardiogram examinations.  Sonographer:    Delcie Roch Referring Phys: [id number] ASHISH ARORA IMPRESSIONS  1. Left ventricular ejection fraction, by estimation, is 65 to 70%. The left ventricle has normal function. The left ventricle has no regional wall motion abnormalities. There is mild left ventricular hypertrophy. Left ventricular diastolic parameters were normal.  2. Right ventricular systolic function is normal. The right ventricular size is normal. Tricuspid regurgitation signal is inadequate for assessing PA pressure.  3. The mitral valve is normal in structure. No evidence of mitral valve regurgitation.  4. The aortic valve is  tricuspid. Aortic valve regurgitation is not visualized. No aortic stenosis is present.  5. The inferior vena cava is normal in size with greater than 50% respiratory variability, suggesting right atrial pressure of 3 mmHg. FINDINGS  Left Ventricle: Left ventricular ejection fraction, by estimation, is 65 to 70%. The left ventricle has normal function. The left ventricle has no regional wall motion abnormalities. The left ventricular internal cavity size was normal in size. There is  mild left ventricular hypertrophy. Left ventricular diastolic parameters were normal. Right Ventricle: The right ventricular size is normal. Right vetricular wall thickness was not assessed. Right ventricular systolic function is normal. Tricuspid regurgitation signal is inadequate for assessing PA pressure. Left Atrium: Left atrial size was normal in size. Right Atrium: Right atrial size was normal in size. Pericardium: There is no evidence of pericardial effusion. Mitral Valve: The mitral valve is normal in structure. No evidence of mitral valve regurgitation. Tricuspid Valve: The tricuspid valve is normal in structure. Tricuspid valve regurgitation is trivial. Aortic Valve: The aortic valve is tricuspid. Aortic valve regurgitation is not visualized. No aortic stenosis is present. Pulmonic Valve: The pulmonic valve was not well visualized. Pulmonic valve regurgitation is trivial. Aorta: The aortic root is normal in size and structure. Venous: The inferior vena cava is normal in size with greater than 50% respiratory variability, suggesting right atrial pressure of 3 mmHg. IAS/Shunts: The interatrial septum was not well visualized.  LEFT VENTRICLE PLAX 2D LVIDd:         4.00 cm  Diastology LVIDs:         2.10 cm  LV e' medial:    9.57 cm/s LV PW:         1.00 cm  LV E/e' medial:  10.0 LV IVS:        0.80 cm  LV e' lateral:   10.10 cm/s LVOT diam:     1.80 cm  LV E/e' lateral: 9.5 LV SV:         51 LV SV Index:   33 LVOT Area:     2.54  cm  RIGHT VENTRICLE  IVC RV S prime:     11.70 cm/s  IVC diam: 1.40 cm TAPSE (M-mode): 2.6 cm LEFT ATRIUM             Index       RIGHT ATRIUM           Index LA diam:        2.50 cm 1.61 cm/m  RA Area:     10.20 cm LA Vol (A2C):   33.2 ml 21.33 ml/m RA Volume:   22.60 ml  14.52 ml/m LA Vol (A4C):   17.6 ml 11.30 ml/m LA Biplane Vol: 24.3 ml 15.61 ml/m  AORTIC VALVE LVOT Vmax:   104.00 cm/s LVOT Vmean:  67.000 cm/s LVOT VTI:    0.202 m  AORTA Ao Root diam: 2.70 cm MITRAL VALVE MV Area (PHT): 3.65 cm     SHUNTS MV Decel Time: 208 msec     Systemic VTI:  0.20 m MV E velocity: 95.70 cm/s   Systemic Diam: 1.80 cm MV A velocity: 110.00 cm/s MV E/A ratio:  0.87 Epifanio Lescheshristopher Schumann MD Electronically signed by Epifanio Lescheshristopher Schumann MD Signature Date/Time: 05/10/2020/5:21:23 PM    Final      Assessment/Plan: 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 Does the need for close, 24 hr/day medical supervision in concert with the patient's rehab needs make it unreasonable for this patient to be served in a less intensive setting? Yes 2. Co-Morbidities requiring supervision/potential complications: DDD, HTN 3. Due to bladder management, bowel management, safety, skin/wound care, disease management, medication administration, pain management and patient education, does the patient require 24 hr/day rehab nursing? Yes 4. Does the patient require coordinated care of a physician, rehab nurse, therapy disciplines of PT, OT.SLP to address physical and functional deficits in the context of the above medical diagnosis(es)? Yes Addressing deficits in the following areas: balance, endurance, locomotion, strength, transferring, bowel/bladder control, bathing, dressing, feeding, grooming, toileting, cognition, speech, language, swallowing and psychosocial support 5. Can the patient actively participate in an intensive therapy program of at least  3 hrs of therapy per day at least 5 days per week? Yes 6. The potential for patient to make measurable gains while on inpatient rehab is excellent 7. Anticipated functional outcomes upon discharge from inpatient rehab are modified independent and supervision  with PT, modified independent and supervision with OT, modified independent and supervision with SLP. 8. Estimated rehab length of stay to reach the above functional goals is: 7-10d 9. Anticipated discharge destination: Home 10. Overall Rehab/Functional Prognosis: excellent  RECOMMENDATIONS: This patient's condition is appropriate for continued rehabilitative care in the following setting: CIR Patient has agreed to participate in recommended program. Yes Note that insurance prior authorization may be required for reimbursement for recommended care.  Comment:    Charlton AmorDaniel J Angiulli, PA-C 05/12/2020  "I have personally performed a face to face diagnostic evaluation of this patient.  Additionally, I have reviewed and concur with the physician assistant's documentation above." Erick ColaceAndrew E. Alicianna Litchford M.D. [facility] FAAPM&R (Neuromuscular Med) Diplomate Am Board of Electrodiagnostic Med Fellow Am Board of Interventional Pain

## 2020-05-12 NOTE — Consult Note (Addendum)
ELECTROPHYSIOLOGY CONSULT NOTE  Patient ID: [name] MRN: [id number], DOB/AGE: [id number]   Admit date: 05/09/2020 Date of Consult: 05/12/2020  Primary Physician: [name], MD Primary Cardiologist: No primary care provider on file.  Primary Electrophysiologist: New to Dr. [name] Reason for Consultation: Cryptogenic stroke; recommendations regarding Implantable Loop Recorder Insurance: BCBS/Medicare  History of Present Illness EP has been asked to evaluate Shelby Keller for placement of an implantable loop recorder to monitor for atrial fibrillation by Dr [name].  The patient was admitted on 05/09/2020 with sudden onset right sided weakness and word finding difficulty. She received IV t-PA Friday 05/09/2020 at 1930.  Imaging demonstrated:   Code Stroke CT Head - No evidence of acute intracranial abnormality. Mild cerebral atrophy with moderate chronic small vessel ischemic disease, progressed as compared to the brain MRI of 05/13/2011. 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   CT head - not ordered  MRI head - patchy left ACA territory infarct.  No hemorrhage.  MRA head - not ordered  CTA H&N -  Severe focal stenosis within the mid to distal A2 left anterior cerebral artery. Severe stenosis within a distal M2 left MCA vessel.   CT Perfusion - not ordered  Carotid Doppler - CTA neck ordered - carotid dopplers not indicated.  EEG - WNL  2D Echo - EF 65 to 70%. No cardiac source of emboli identified.   Sars Corona Virus 2  - negative  LDL - 182  HgbA1c - 4.7  UDS - not ordered    They have undergone workup for stroke as above. The patient has been monitored on telemetry which has demonstrated sinus rhythm with no arrhythmias.  Inpatient stroke work-up will not require a TEE per Neurology.   Echocardiogram this admission demonstrated LVEF 65-70%.  Lab work is reviewed.  Prior to admission, the patient denies chest  pain, shortness of breath, dizziness, palpitations, or syncope.  She is recovering from her stroke with plans to return home  at discharge.  History reviewed. No pertinent past medical history.   Surgical History:  Past Surgical History:  Procedure Laterality Date  . BREAST CYST ASPIRATION Right      Medications Prior to Admission  Medication Sig Dispense Refill Last Dose  . doxylamine, Sleep, (UNISOM) 25 MG tablet Take 25 mg by mouth at bedtime as needed for sleep.   unk  . levothyroxine (SYNTHROID) 75 MCG tablet Take 75 mcg by mouth daily.    05/09/2020 at Unknown time  . losartan (COZAAR) 50 MG tablet Take 50 mg by mouth daily.   05/09/2020 at Unknown time  . oxymetazoline (AFRIN) 0.05 % nasal spray Place 1 spray into both nostrils as needed for congestion.   unk  . pantoprazole (PROTONIX) 40 MG tablet Take 40 mg by mouth daily as needed (acid reflux).    05/09/2020 at Unknown time  . PARoxetine (PAXIL) 40 MG tablet Take 40 mg by mouth daily.   05/09/2020 at Unknown time  . propranolol (INDERAL) 10 MG tablet Take 20 mg by mouth 2 (two) times daily.    05/09/2020 at Unknown time    Inpatient Medications:  . aspirin EC  81 mg Oral Daily  . atorvastatin  40 mg Oral Daily  . influenza vaccine adjuvanted  0.5 mL Intramuscular Tomorrow-1000  . levothyroxine  75 mcg Oral Daily  . losartan  50 mg Oral Daily  . PARoxetine  40 mg Oral Daily  . pneumococcal 23 valent  vaccine  0.5 mL Intramuscular Tomorrow-1000  . propranolol  20 mg Oral BID    Allergies:  Allergies  Allergen Reactions  . Lisinopril Cough    Social History   Socioeconomic History  . Marital status: Widowed    Spouse name: Not on file  . Number of children: Not on file  . Years of education: Not on file  . Highest education level: Not on file  Occupational History  . Not on file  Tobacco Use  . Smoking status: Never Smoker  . Smokeless tobacco: Never Used  Substance and Sexual Activity  . Alcohol use: Never  .  Drug use: Never  . Sexual activity: Not on file  Other Topics Concern  . Not on file  Social History Narrative  . Not on file   Social Determinants of Health   Financial Resource Strain:   . Difficulty of Paying Living Expenses: Not on file  Food Insecurity:   . Worried About Programme researcher, broadcasting/film/videounning Out of Food in the Last Year: Not on file  . Ran Out of Food in the Last Year: Not on file  Transportation Needs:   . Lack of Transportation (Medical): Not on file  . Lack of Transportation (Non-Medical): Not on file  Physical Activity:   . Days of Exercise per Week: Not on file  . Minutes of Exercise per Session: Not on file  Stress:   . Feeling of Stress : Not on file  Social Connections:   . Frequency of Communication with Friends and Family: Not on file  . Frequency of Social Gatherings with Friends and Family: Not on file  . Attends Religious Services: Not on file  . Active Member of Clubs or Organizations: Not on file  . Attends BankerClub or Organization Meetings: Not on file  . Marital Status: Not on file  Intimate Partner Violence:   . Fear of Current or Ex-Partner: Not on file  . Emotionally Abused: Not on file  . Physically Abused: Not on file  . Sexually Abused: Not on file     Family History  Problem Relation Age of Onset  . Breast cancer Neg Hx       Review of Systems: All other systems reviewed and are otherwise negative except as noted above.  Physical Exam: Vitals:   05/11/20 2300 05/12/20 0315 05/12/20 0952 05/12/20 1256  BP: (!) 165/93 (!) 153/76 126/72 (!) 135/96  Pulse: 70 70 80 100  Resp: (!) 21 (!) 22  18  Temp: 97.6 F (36.4 C) 98 F (36.7 C) 97.9 F (36.6 C) 97.9 F (36.6 C)  TempSrc: Oral Oral Oral Oral  SpO2: 96% 97% 97%   Weight:      Height:        GEN- The patient is well appearing, alert and oriented x 3 today.   Head- normocephalic, atraumatic Eyes-  Sclera clear, conjunctiva pink Ears- hearing intact Oropharynx- clear Neck- supple Lungs- Clear  to ausculation bilaterally, normal work of breathing Heart- Regular rate and rhythm, no murmurs, rubs or gallops  GI- soft, NT, ND, + BS Extremities- no clubbing, cyanosis, or edema MS- no significant deformity or atrophy Skin- no rash or lesion Psych- euthymic mood, full affect Neuro-- word finding difficulty   Labs:   Lab Results  Component Value Date   WBC 5.9 05/12/2020   HGB 11.4 (L) 05/12/2020   HCT 32.8 (L) 05/12/2020   MCV 89.4 05/12/2020   PLT 155 05/12/2020    Recent Labs  Lab 05/09/20  1830 05/09/20 1832 05/12/20 0109  NA 140   < > 139  K 3.2*   < > 3.2*  CL 105   < > 106  CO2 24   < > 23  BUN 13   < > <5*  CREATININE 1.23*   < > 0.93  CALCIUM 9.3   < > 8.9  PROT 6.2*  --   --   BILITOT 0.9  --   --   ALKPHOS 108  --   --   ALT 11  --   --   AST 15  --   --   GLUCOSE 124*   < > 95   < > = values in this interval not displayed.     Radiology/Studies: CT Code Stroke CTA Head W/WO contrast  Result Date: 05/09/2020 CLINICAL DATA:  Stroke, right leg weakness. EXAM: CT ANGIOGRAPHY HEAD AND NECK TECHNIQUE: Multidetector CT imaging of the head and neck was performed using the standard protocol during bolus administration of intravenous contrast. Multiplanar CT image reconstructions and MIPs were obtained to evaluate the vascular anatomy. Carotid stenosis measurements (when applicable) are obtained utilizing NASCET criteria, using the distal internal carotid diameter as the denominator. CONTRAST:  65mL OMNIPAQUE IOHEXOL 350 MG/ML SOLN COMPARISON:  Noncontrast head CT performed earlier the same day 05/09/2020 FINDINGS: CTA NECK FINDINGS Aortic arch: Common origin of the innominate and left common carotid arteries. Minimal calcified plaque within the visualized aortic arch. No hemodynamically significant innominate or proximal subclavian artery stenosis. Right carotid system: CCA and ICA patent within the neck without stenosis. Minimal atherosclerotic plaque within the  proximal ICA. Left carotid system: CCA and ICA patent within the neck without significant stenosis (50% or greater). Mild to moderate calcified plaque within the carotid bifurcation and proximal ICA. Vertebral arteries: Codominant and patent within the neck without stenosis. Skeleton: No acute bony abnormality or aggressive osseous lesion. Other neck: No neck mass or cervical lymphadenopathy. Poorly delineated thyroid gland which may be atrophic or surgically absent. Upper chest: No consolidation within the imaged lung apices. Review of the MIP images confirms the above findings CTA HEAD FINDINGS Anterior circulation: The intracranial internal carotid arteries are patent. Mild plaque within both vessels without stenosis. The M1 middle cerebral arteries are patent. No M2 proximal branch occlusion is identified. High-grade stenosis within a distal M2 left MCA branch vessel (series 12, image 28) (series 10, image 17). The anterior cerebral arteries are patent. Severe focal stenosis within the mid to distal A2 left anterior cerebral artery (series seven, image 68). No intracranial aneurysm is identified. Posterior circulation: The intracranial vertebral arteries are patent. The basilar artery is patent. The posterior cerebral arteries are patent. Posterior communicating arteries are present bilaterally. Venous sinuses: Within the limitations of contrast timing, no convincing thrombus. Anatomic variants: None significant Review of the MIP images confirms the above findings These results were called by telephone at the time of interpretation on 05/09/2020 at 7:05 pm to provider [facility] , who verbally acknowledged these results. IMPRESSION: CTA neck: The common carotid, internal carotid and vertebral arteries are patent within the neck without hemodynamically significant stenosis. Atherosclerotic plaque within the carotid systems as described. CTA head: 1. Severe focal stenosis within the mid to distal A2 left anterior  cerebral artery. 2. Severe stenosis within a distal M2 left MCA vessel. Electronically Signed   By: Jackey Loge DO   On: 05/09/2020 19:23   CT Code Stroke CTA Neck W/WO contrast  Result Date: 05/09/2020 CLINICAL  DATA:  Stroke, right leg weakness. EXAM: CT ANGIOGRAPHY HEAD AND NECK TECHNIQUE: Multidetector CT imaging of the head and neck was performed using the standard protocol during bolus administration of intravenous contrast. Multiplanar CT image reconstructions and MIPs were obtained to evaluate the vascular anatomy. Carotid stenosis measurements (when applicable) are obtained utilizing NASCET criteria, using the distal internal carotid diameter as the denominator. CONTRAST:  75mL OMNIPAQUE IOHEXOL 350 MG/ML SOLN COMPARISON:  Noncontrast head CT performed earlier the same day 05/09/2020 FINDINGS: CTA NECK FINDINGS Aortic arch: Common origin of the innominate and left common carotid arteries. Minimal calcified plaque within the visualized aortic arch. No hemodynamically significant innominate or proximal subclavian artery stenosis. Right carotid system: CCA and ICA patent within the neck without stenosis. Minimal atherosclerotic plaque within the proximal ICA. Left carotid system: CCA and ICA patent within the neck without significant stenosis (50% or greater). Mild to moderate calcified plaque within the carotid bifurcation and proximal ICA. Vertebral arteries: Codominant and patent within the neck without stenosis. Skeleton: No acute bony abnormality or aggressive osseous lesion. Other neck: No neck mass or cervical lymphadenopathy. Poorly delineated thyroid gland which may be atrophic or surgically absent. Upper chest: No consolidation within the imaged lung apices. Review of the MIP images confirms the above findings CTA HEAD FINDINGS Anterior circulation: The intracranial internal carotid arteries are patent. Mild plaque within both vessels without stenosis. The M1 middle cerebral arteries are patent.  No M2 proximal branch occlusion is identified. High-grade stenosis within a distal M2 left MCA branch vessel (series 12, image 28) (series 10, image 17). The anterior cerebral arteries are patent. Severe focal stenosis within the mid to distal A2 left anterior cerebral artery (series seven, image 68). No intracranial aneurysm is identified. Posterior circulation: The intracranial vertebral arteries are patent. The basilar artery is patent. The posterior cerebral arteries are patent. Posterior communicating arteries are present bilaterally. Venous sinuses: Within the limitations of contrast timing, no convincing thrombus. Anatomic variants: None significant Review of the MIP images confirms the above findings These results were called by telephone at the time of interpretation on 05/09/2020 at 7:05 pm to provider [facility] , who verbally acknowledged these results. IMPRESSION: CTA neck: The common carotid, internal carotid and vertebral arteries are patent within the neck without hemodynamically significant stenosis. Atherosclerotic plaque within the carotid systems as described. CTA head: 1. Severe focal stenosis within the mid to distal A2 left anterior cerebral artery. 2. Severe stenosis within a distal M2 left MCA vessel. Electronically Signed   By: Jackey Loge DO   On: 05/09/2020 19:23   MR BRAIN WO CONTRAST  Result Date: 05/10/2020 CLINICAL DATA:  Acute neuro deficit. Stroke. Post tPA. Right-sided weakness. EXAM: MRI HEAD WITHOUT CONTRAST TECHNIQUE: Multiplanar, multiecho pulse sequences of the brain and surrounding structures were obtained without intravenous contrast. COMPARISON:  CT head 05/09/2020 FINDINGS: Brain: Acute infarct left anterior cerebral artery territory. Restricted diffusion in the anterior corpus callosum on the left extending into the cingulate gyrus and left medial frontal lobe. Incomplete left ACA infarct. No associated hemorrhage Moderate chronic microvascular ischemic change in  the white matter and pons. Negative for mass lesion. Ventricle size normal. Vascular: Normal arterial flow voids. Skull and upper cervical spine: No focal skeletal lesion. Sinuses/Orbits: Mild mucosal edema paranasal sinuses. Right cataract extraction Other: None IMPRESSION: Incomplete acute infarct left ACA territory. Negative for hemorrhage Moderate chronic microvascular ischemic change. Electronically Signed   By: Marlan Palau M.D.   On: 05/10/2020 13:11  EEG adult  Result Date: 05/11/2020 Charlsie Quest, MD     05/11/2020  6:28 PM Patient Name: Shelby Keller MRN: [id number] Epilepsy Attending: Charlsie Quest Referring Physician/Provider: Dr Delia Heady Date: 05/11/2020 Duration: 24.13 mins Patient history: 74 y.o. female presented to the emergency room for sudden onset right sided weakness and word finding difficulty. EEG to evaluate for seizure. Level of alertness: Awake AEDs during EEG study: None Technical aspects: This EEG study was done with scalp electrodes positioned according to the 10-20 International system of electrode placement. Electrical activity was acquired at a sampling rate of  and reviewed with a high frequency filter of  and a low frequency filter of . EEG data were recorded continuously and digitally stored. Description: The posterior dominant rhythm consists of 10-11 Hz activity of moderate voltage (25-35 uV) seen predominantly in posterior head regions, symmetric and reactive to eye opening and eye closing. Hyperventilation and photic stimulation were not performed.   IMPRESSION: This study is within normal limits. No seizures or epileptiform discharges were seen throughout the recording. Charlsie Quest   ECHOCARDIOGRAM COMPLETE  Result Date: 05/10/2020    ECHOCARDIOGRAM REPORT   Patient Name:   DECLYN OFFIELD Date of Exam: 05/10/2020 Medical Rec #:  [id number]      Height:       64.0 in Accession #:    [id number]     Weight:       116.8 lb Date of Birth:   24-Dec-1945      BSA:          1.557 m Patient Age:    74 years       BP:           141/106 mmHg Patient Gender: F              HR:           69 bpm. Exam Location:  Inpatient Procedure: 2D Echo Indications:    stroke 434.91  History:        Patient has no prior history of Echocardiogram examinations.  Sonographer:    Delcie Roch Referring Phys: [id number] ASHISH ARORA IMPRESSIONS  1. Left ventricular ejection fraction, by estimation, is 65 to 70%. The left ventricle has normal function. The left ventricle has no regional wall motion abnormalities. There is mild left ventricular hypertrophy. Left ventricular diastolic parameters were normal.  2. Right ventricular systolic function is normal. The right ventricular size is normal. Tricuspid regurgitation signal is inadequate for assessing PA pressure.  3. The mitral valve is normal in structure. No evidence of mitral valve regurgitation.  4. The aortic valve is tricuspid. Aortic valve regurgitation is not visualized. No aortic stenosis is present.  5. The inferior vena cava is normal in size with greater than 50% respiratory variability, suggesting right atrial pressure of 3 mmHg. FINDINGS  Left Ventricle: Left ventricular ejection fraction, by estimation, is 65 to 70%. The left ventricle has normal function. The left ventricle has no regional wall motion abnormalities. The left ventricular internal cavity size was normal in size. There is  mild left ventricular hypertrophy. Left ventricular diastolic parameters were normal. Right Ventricle: The right ventricular size is normal. Right vetricular wall thickness was not assessed. Right ventricular systolic function is normal. Tricuspid regurgitation signal is inadequate for assessing PA pressure. Left Atrium: Left atrial size was normal in size. Right Atrium: Right atrial size was normal in size. Pericardium: There is no evidence of pericardial effusion. Mitral Valve:  The mitral valve is normal in structure. No  evidence of mitral valve regurgitation. Tricuspid Valve: The tricuspid valve is normal in structure. Tricuspid valve regurgitation is trivial. Aortic Valve: The aortic valve is tricuspid. Aortic valve regurgitation is not visualized. No aortic stenosis is present. Pulmonic Valve: The pulmonic valve was not well visualized. Pulmonic valve regurgitation is trivial. Aorta: The aortic root is normal in size and structure. Venous: The inferior vena cava is normal in size with greater than 50% respiratory variability, suggesting right atrial pressure of 3 mmHg. IAS/Shunts: The interatrial septum was not well visualized.  LEFT VENTRICLE PLAX 2D LVIDd:         4.00 cm  Diastology LVIDs:         2.10 cm  LV e' medial:    9.57 cm/s LV PW:         1.00 cm  LV E/e' medial:  10.0 LV IVS:        0.80 cm  LV e' lateral:   10.10 cm/s LVOT diam:     1.80 cm  LV E/e' lateral: 9.5 LV SV:         51 LV SV Index:   33 LVOT Area:     2.54 cm  RIGHT VENTRICLE             IVC RV S prime:     11.70 cm/s  IVC diam: 1.40 cm TAPSE (M-mode): 2.6 cm LEFT ATRIUM             Index       RIGHT ATRIUM           Index LA diam:        2.50 cm 1.61 cm/m  RA Area:     10.20 cm LA Vol (A2C):   33.2 ml 21.33 ml/m RA Volume:   22.60 ml  14.52 ml/m LA Vol (A4C):   17.6 ml 11.30 ml/m LA Biplane Vol: 24.3 ml 15.61 ml/m  AORTIC VALVE LVOT Vmax:   104.00 cm/s LVOT Vmean:  67.000 cm/s LVOT VTI:    0.202 m  AORTA Ao Root diam: 2.70 cm MITRAL VALVE MV Area (PHT): 3.65 cm     SHUNTS MV Decel Time: 208 msec     Systemic VTI:  0.20 m MV E velocity: 95.70 cm/s   Systemic Diam: 1.80 cm MV A velocity: 110.00 cm/s MV E/A ratio:  0.87 Epifanio Lesches MD Electronically signed by Epifanio Lesches MD Signature Date/Time: 05/10/2020/5:21:23 PM    Final    CT HEAD CODE STROKE WO CONTRAST  Result Date: 05/09/2020 CLINICAL DATA:  Neuro deficit, acute, stroke suspected. Right leg weakness. EXAM: CT HEAD WITHOUT CONTRAST TECHNIQUE: Contiguous axial images were  obtained from the base of the skull through the vertex without intravenous contrast. COMPARISON:  Brain MRI 05/13/2011. FINDINGS: Brain: Mild cerebral atrophy. Moderate ill-defined hypoattenuation within the cerebral white matter is nonspecific, but compatible with chronic small vessel ischemic disease. 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 There is no acute intracranial hemorrhage. No demarcated cortical infarct. No extra-axial fluid collection. No evidence of intracranial mass. No midline shift. Vascular: No hyperdense vessel.  Atherosclerotic calcifications. Skull: Normal. Negative for fracture or focal lesion. Sinuses/Orbits: Visualized orbits show no acute finding. Mild ethmoid and maxillary sinus mucosal thickening. ASPECTS [facility] Stroke Program Early CT Score) - Ganglionic level infarction (caudate, lentiform nuclei, internal capsule, insula, M1-M3 cortex): 7 - Supraganglionic infarction (M4-M6 cortex): 3 Total score (0-10 with 10 being normal): 10 These  results were called by telephone at the time of interpretation on 05/09/2020 at 6:41 pm to provider Dr. Wilford Corner, who verbally acknowledged these results. IMPRESSION: No evidence of acute intracranial abnormality. Mild cerebral atrophy with moderate chronic small vessel ischemic disease, progressed as compared to the brain MRI of 05/13/2011. 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 Electronically Signed   By: Jackey Loge DO   On: 05/09/2020 18:41    12-lead ECG NSR at 69 bpm (personally reviewed) No prior EKG's available. No history of atrial fibrillation mentioned in her chart review.   Telemetry NSR 60s up to sinus tachycardia as high as 110-120s. She had a brief period of what appeared to be ST in the 130s, but ? Over counting (personally reviewed)  Assessment and Plan:  1. Cryptogenic stroke The patient presents with cryptogenic stroke.  The patient does not  have a TEE planned for this AM.  I spoke at length with the patient about monitoring for afib with an implantable loop recorder.  Risks, benefits, and alteratives to implantable loop recorder were discussed with the patient today.   At this time, the patient is very clear in their decision to proceed with implantable loop recorder.   Wound care was reviewed with the patient (keep incision clean and dry for 3 days).  Wound check scheduled and entered in AVS. Please call with questions.   Graciella Freer, PA-C 05/12/2020 1:20 PM\  Stroke  HTN   Tachypalpitations--irregular and prolonged   Pt with stroke and making good recovery.  Infrequent tachypalps makes wearable monitoring not likely fruitful so will plan, as per neuro requests, to implant loop.    Reviewed risk and benefit

## 2020-05-12 NOTE — Discharge Instructions (Signed)

## 2020-05-12 NOTE — Progress Notes (Signed)
STROKE TEAM PROGRESS NOTE   INTERVAL HISTORY Patient is sitting up comfortably in bed.  She states she is doing better and wants to go home.  Vital signs are stable.  Neurological exam is unchanged.  OBJECTIVE Vitals:   05/11/20 1934 05/11/20 2300 05/12/20 0315 05/12/20 0952  BP: (!) 153/73 (!) 165/93 (!) 153/76 126/72  Pulse: 88 70 70 80  Resp: 20 (!) 21 (!) 22   Temp: 98.1 F (36.7 C) 97.6 F (36.4 C) 98 F (36.7 C) 97.9 F (36.6 C)  TempSrc: Oral Oral Oral Oral  SpO2: 96% 96% 97% 97%  Weight:      Height:       CBC:  Recent Labs  Lab 05/09/20 1830 05/09/20 1832 05/11/20 0124 05/12/20 0109  WBC 7.3   < > 5.0 5.9  NEUTROABS 5.2  --   --   --   HGB 13.4   < > 10.6* 11.4*  HCT 40.5   < > 30.9* 32.8*  MCV 93.5   < > 90.1 89.4  PLT 194   < > 144* 155   < > = values in this interval not displayed.   Basic Metabolic Panel:  Recent Labs  Lab 05/11/20 0124 05/12/20 0109  NA 142 139  K 3.1* 3.2*  CL 111 106  CO2 22 23  GLUCOSE 91 95  BUN 7* <5*  CREATININE 1.08* 0.93  CALCIUM 8.3* 8.9   Lipid Panel:     Component Value Date/Time   CHOL 246 (H) 05/10/2020 0103   TRIG 88 05/10/2020 0103   HDL 46 05/10/2020 0103   CHOLHDL 5.3 05/10/2020 0103   VLDL 18 05/10/2020 0103   LDLCALC 182 (H) 05/10/2020 0103   HgbA1c:  Lab Results  Component Value Date   HGBA1C 4.7 (L) 05/10/2020   Urine Drug Screen: No results found for: LABOPIA, COCAINSCRNUR, LABBENZ, AMPHETMU, THCU, LABBARB  Alcohol Level No results found for: [facility]  IMAGING CT HEAD CODE STROKE WO CONTRAST 05/09/2020 No evidence of acute intracranial abnormality. Mild cerebral atrophy with moderate chronic small vessel ischemic disease, progressed as compared to the brain MRI of 05/13/2011. 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   CT Code Stroke CTA Neck W/WO contrast 05/09/2020 The common carotid, internal carotid and vertebral arteries are patent within the neck  without hemodynamically significant stenosis. Atherosclerotic plaque within the carotid systems as described.   CT Code Stroke CTA Head W/WO contrast 05/09/2020 1. Severe focal stenosis within the mid to distal A2 left anterior cerebral artery.  2. Severe stenosis within a distal M2 left MCA vessel.   MRI BRAIN WO CONTRAST - patchy left anterior cerebral artery acute infarct.  No hemorrhage  05/10/2020 IMPRESSION: Incomplete acute infarct left ACA territory. Negative for hemorrhage Moderate chronic microvascular ischemic change.  Transthoracic Echocardiogram  05/10/2020 1. Left ventricular ejection fraction, by estimation, is 65 to 70%. The left ventricle has normal function. The left ventricle has no regional wall motion abnormalities. There is mild left ventricular hypertrophy. Left ventricular diastolic parameters were normal.  2. Right ventricular systolic function is normal. The right ventricular size is normal. Tricuspid regurgitation signal is inadequate for assessing PA pressure.  3. The mitral valve is normal in structure. No evidence of mitral valve regurgitation.  4. The aortic valve is tricuspid. Aortic valve regurgitation is not visualized. No aortic stenosis is present.  5. The inferior vena cava is normal in size with greater than 50% respiratory variability, suggesting right atrial pressure  of 3 mmHg.   EEG  05/11/2020  This study is within normal limits. No seizures or epileptiform discharges were seen throughout the recording.  ECG - SR rate 69 BPM. (See cardiology reading for complete details)   PHYSICAL EXAM     Blood pressure 126/72, pulse 80, temperature 97.9 F (36.6 C), temperature source Oral, resp. rate (!) 22, height 5\' 4"  (1.626 m), weight 53 kg, SpO2 97 %. Pleasant elderly Caucasian lady not in distress. . Afebrile. Head is nontraumatic. Neck is supple without bruit.    Cardiac exam no murmur or gallop. Lungs are clear to auscultation. Distal pulses are  well felt. Neurological Exam ;  Awake alert oriented to time place and person.  Speech is slightly hesitant and nonfluent with occasional word finding difficulties but no dysarthria.  Follows commands well.  Extraocular movements are full range without nystagmus.  Slight right lower facial asymmetry.  Tongue midline.  No upper extremity drift but slight diminished fine finger movements on the right and orbits left over right upper extremity.  Right lower extremity drift with 45 strength with slight increased tone.  Sensation intact bilaterally.  Plantars downgoing.   ASSESSMENT/PLAN [name] is a 74 y.o. female with history of degenerative disc disease and thyroid disease, presents to the emergency room for sudden onset right sided weakness and word finding difficulty. 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 Mild cerebral atrophy with moderate chronic small vessel ischemic disease, progressed as compared to the brain MRI of 05/13/2011. 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   MRI head - patchy left ACA territory infarct.  No hemorrhage.  CTA H&N -  Severe focal stenosis within the mid to distal A2 left anterior cerebral artery. Severe stenosis within a distal M2 left MCA vessel.   EEG - normal   2D Echo - EF 65 to 70%. No cardiac source of emboli identified.   Loop implant recommended by Dr 14/11/2010 Virus 2  - negative  LDL - 182  HgbA1c - 4.7  VTE prophylaxis - SCDs  No antithrombotic prior to admission, now on aspirin 81 mg daily and Plavix 75 mg daily for 3 weeks followed by aspirin alone  Therapy recommendations:  CIR - consult placed  Disposition:  Pending  Recurrent neurologic symptoms 12/4 - improved with saline bolus and  lying flat.   Hypertension  Home BP meds: Cozaar and Inderal  Current BP meds: none  . Resume home BP meds . Permissive hypertension (OK if < 220/120) but gradually normalize in 5-7 days  . Long-term BP goal normotensive  Hyperlipidemia  Home Lipid lowering medication: none   LDL 182, goal < 70  Current lipid lowering medication: lipitor 40 added  Continue statin at discharge  Other Stroke Risk Factors  Advanced age  Chronic lacunar infarct within the right thalamocapsular junction by imaging  Other Active Problems  Hypokalemia - potassium - 3.2 ->3.3 - supplement - 3.1 - supplement - 3.2 - repeat labs am  CKD - stage 3a - creatinine - 1.20->1.08->0.93  Anemia - Hgb - 13.4->12.6->10.6->11.4   Mild thrombocytopenia - platelets - 194->144->155  Check magnesium pending   Hospital day # 3 Continue mobilization up out of bed and therapy consults.  Loop  recorder to be inserted by EP team prior to discharge.  Discharge home later today versus rehab depending upon how well she does with physical therapy.  Discussed with Dr. Lalla Brothers and EP team Long discussion patient and answered questions. Delia Heady, MD To contact Stroke Continuity provider, please refer to [email or web address]. After hours, contact General Neurology

## 2020-05-12 NOTE — Progress Notes (Signed)
Inpatient Rehabilitation Admissions Coordinator  I met at bedside with patient to discuss rehab options. Noted PT has changed their recommendation to Home with [facility] if patient has 24/7 supervision and TOC has arranged [place]. Patient states she is going home today.  Danne Baxter, RN, MSN Rehab Admissions Coordinator [phone] 05/12/2020 4:07 PM

## 2020-05-12 NOTE — Discharge Summary (Addendum)
Stroke Discharge Summary  Patient ID: Shelby Keller   MRN: [id number]      DOB: [birth year]  Date of Admission: 05/09/2020 Date of Discharge: 05/12/2020  Attending Physician:  Micki Riley, MD, Stroke MD Consultant(s):   Sherryl Manges MD (electrophysiology)  Patient's PCP:  [name], MD  DISCHARGE DIAGNOSIS:  Principal Problem:   Acute ischemic stroke [facility]) L MCA s/p tPA, embolic source unk Active Problems:   Essential hypertension   Hyperlipidemia   Hypokalemia   Hypomagnesemia   Thrombocytopenia (HCC)   Allergies as of 05/12/2020       Reactions   Lisinopril Cough        Medication List     TAKE these medications    aspirin 81 MG EC tablet Take 1 tablet (81 mg total) by mouth daily. Swallow whole. Start taking on: May 13, 2020   atorvastatin 40 MG tablet Commonly known as: LIPITOR Take 1 tablet (40 mg total) by mouth daily. Start taking on: May 13, 2020   clopidogrel 75 MG tablet Commonly known as: PLAVIX Take 1 tablet (75 mg total) by mouth daily for 21 days. Start taking on: May 13, 2020   doxylamine (Sleep) 25 MG tablet Commonly known as: UNISOM Take 25 mg by mouth at bedtime as needed for sleep.   levothyroxine 75 MCG tablet Commonly known as: SYNTHROID Take 75 mcg by mouth daily.   losartan 50 MG tablet Commonly known as: COZAAR Take 50 mg by mouth daily.   oxymetazoline 0.05 % nasal spray Commonly known as: AFRIN Place 1 spray into both nostrils as needed for congestion.   pantoprazole 40 MG tablet Commonly known as: PROTONIX Take 40 mg by mouth daily as needed (acid reflux).   PARoxetine 40 MG tablet Commonly known as: PAXIL Take 40 mg by mouth daily.   potassium chloride SA 20 MEQ tablet Commonly known as: Klor-Con M20 Take 2 tablets (40 mEq total) by mouth once for 1 dose. TAKE AT LEAST 4 HOURS FOLLOWING YOUR LAST DOSE IN THE HOSPITAL. IF YOU RECEIVED 2 DOSES IN THE HOSPITAL, DO NOT FILL   propranolol  10 MG tablet Commonly known as: INDERAL Take 20 mg by mouth 2 (two) times daily.               Durable Medical Equipment  (From admission, onward)           Start     Ordered   05/12/20 1332  For home use only DME 3 n 1  Once        05/12/20 1331   05/12/20 1331  For home use only DME Walker youth  Once       Question:  Patient needs a walker to treat with the following condition  Answer:  Stroke (HCC)   05/12/20 1331           LABORATORY STUDIES CBC    Component Value Date/Time   WBC 5.9 05/12/2020 0109   RBC 3.67 (L) 05/12/2020 0109   HGB 11.4 (L) 05/12/2020 0109   HCT 32.8 (L) 05/12/2020 0109   PLT 155 05/12/2020 0109   MCV 89.4 05/12/2020 0109   MCH 31.1 05/12/2020 0109   MCHC 34.8 05/12/2020 0109   RDW 12.0 05/12/2020 0109   LYMPHSABS 1.5 05/09/2020 1830   MONOABS 0.4 05/09/2020 1830   EOSABS 0.1 05/09/2020 1830   BASOSABS 0.0 05/09/2020 1830   CMP    Component Value Date/Time   NA  139 05/12/2020 0109   K 3.2 (L) 05/12/2020 0109   CL 106 05/12/2020 0109   CO2 23 05/12/2020 0109   GLUCOSE 95 05/12/2020 0109   BUN <5 (L) 05/12/2020 0109   CREATININE 0.93 05/12/2020 0109   CALCIUM 8.9 05/12/2020 0109   PROT 6.2 (L) 05/09/2020 1830   ALBUMIN 3.7 05/09/2020 1830   AST 15 05/09/2020 1830   ALT 11 05/09/2020 1830   ALKPHOS 108 05/09/2020 1830   BILITOT 0.9 05/09/2020 1830   GFRNONAA >60 05/12/2020 0109   COAGS Lab Results  Component Value Date   INR 1.0 05/09/2020   Lipid Panel    Component Value Date/Time   CHOL 246 (H) 05/10/2020 0103   TRIG 88 05/10/2020 0103   HDL 46 05/10/2020 0103   CHOLHDL 5.3 05/10/2020 0103   VLDL 18 05/10/2020 0103   LDLCALC 182 (H) 05/10/2020 0103   HgbA1C  Lab Results  Component Value Date   HGBA1C 4.7 (L) 05/10/2020    SIGNIFICANT DIAGNOSTIC STUDIES CT HEAD CODE STROKE WO CONTRAST 05/09/2020 No evidence of acute intracranial abnormality. Mild cerebral atrophy with moderate chronic small vessel  ischemic disease, progressed as compared to the brain MRI of 05/13/2011. 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    CT Code Stroke CTA Neck W/WO contrast 05/09/2020 The common carotid, internal carotid and vertebral arteries are patent within the neck without hemodynamically significant stenosis. Atherosclerotic plaque within the carotid systems as described.    CT Code Stroke CTA Head W/WO contrast 05/09/2020 1. Severe focal stenosis within the mid to distal A2 left anterior cerebral artery.  2. Severe stenosis within a distal M2 left MCA vessel.    MRI BRAIN WO CONTRAST - patchy left anterior cerebral artery acute infarct.  No hemorrhage  05/10/2020 Incomplete acute infarct left ACA territory. Negative for hemorrhage Moderate chronic microvascular ischemic change.   Transthoracic Echocardiogram  05/10/2020  1. Left ventricular ejection fraction, by estimation, is 65 to 70%. The left ventricle has normal function. The left ventricle has no regional wall motion abnormalities. There is mild left ventricular hypertrophy. Left ventricular diastolic parameters were normal.   2. Right ventricular systolic function is normal. The right ventricular size is normal. Tricuspid regurgitation signal is inadequate for assessing PA pressure.   3. The mitral valve is normal in structure. No evidence of mitral valve regurgitation.   4. The aortic valve is tricuspid. Aortic valve regurgitation is not visualized. No aortic stenosis is present.   5. The inferior vena cava is normal in size with greater than 50% respiratory variability, suggesting right atrial pressure of 3 mmHg.    EEG  05/11/2020  This study is within normal limits. No seizures or epileptiform discharges were seen throughout the recording.   ECG - SR rate 69 BPM.      HISTORY OF PRESENT ILLNESS Shelby Keller is a 74 y.o. female past medical history of degenerative disc disease, thyroid disease,  presents to the emergency room for sudden onset right sided weakness and word finding difficulty. She was in her usual state of health-after talking to the daughter in detail-at around 3:30 PM when she ate with her grandson and appeared normal.  Around 5 PM, the family member checked and she was sitting on the stairs not moving.  She had difficulty with her words.  She was also not moving her right leg-that appeared stiff.  She is not able to move her right arm as strongly as well. She reports  that she had started having some trouble walking upstairs at 11-12 but the family does not corroborate that. She was Last known well after speaking with multiple people is 3:30 PM 05/09/2020. Denies any headache.  Denies visual symptoms. Premorbid modified Rankin scale (mRS): 1. She was given tPA and admitted to the hospital for further evaulation and treatment.   HOSPITAL COURSE [name] is a 74 y.o. female with history of degenerative disc disease and thyroid disease, presents to the emergency room for sudden onset right sided weakness and word finding difficulty. The patient received IV t-PA Friday 05/09/20 at 1930.Recurrent neurologic symptoms 12/4 - improved with saline bolus and lying flat. Stabilized. Infarct felt to be embolic. Loop placed. [facility].   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 Mild cerebral atrophy. Moderate small vessel ischemic disease, progressed since MRI 05/13/2011. Chronic R thalamocapsular junction lacunar infarct  MRI head - patchy left ACA territory infarct.  No hemorrhage. CTA H&N -  Severe focal stenosis within the mid to distal A2 left anterior cerebral artery. Severe stenosis within a distal M2 left MCA vessel.  EEG - normal  2D Echo - EF 65 to 70%. No cardiac source of emboli identified.  Loop implant recommended by Dr Pearlean BrownieSethi and place by Dr. Dortha KernKleain 05/12/2020    Loyal JacobsonSars Corona Virus 2   - negative LDL - 182 HgbA1c - 4.7 No antithrombotic prior to admission, now on aspirin 81 mg daily and Plavix 75 mg daily for 3 weeks followed by aspirin alone Therapy recommendations:  [facility] Disposition:  return home   Hypertension Home BP meds: Cozaar and Inderal Resumed home BP meds BP goal normotensive   Hyperlipidemia Home Lipid lowering medication: none  LDL 182, goal < 70 lipitor 40 added Continue statin at discharge   Other Stroke Risk Factors Advanced age Chronic lacunar infarct within the right thalamocapsular junction by imaging   Other Active Problems Hypokalemia - potassium - 3.2 ->3.3 - supplement - 3.1 - supplement - 3.2 - supplement AKI - creatinine - 1.20->1.08->0.93 - improved Mild Anemia - Hgb - 13.4->12.6->10.6->11.4  Mild thrombocytopenia - platelets - 194->144->155 Magnesium 1.6 - replace   DISCHARGE EXAM Blood pressure (!) 135/96, pulse 100, temperature 97.9 F (36.6 C), temperature source Oral, resp. rate 18, height 5\' 4"  (1.626 m), weight 53 kg, SpO2 97 %. Pleasant elderly Caucasian lady not in distress. . Afebrile. Head is nontraumatic. Neck is supple without bruit.    Cardiac exam no murmur or gallop. Lungs are clear to auscultation. Distal pulses are well felt. Neurological Exam ;  Awake alert oriented to time place and person.  Speech is slightly hesitant and nonfluent with occasional word finding difficulties but no dysarthria.  Follows commands well.  Extraocular movements are full range without nystagmus.  Slight right lower facial asymmetry.  Tongue midline. No upper extremity drift but slight diminished fine finger movements on the right and orbits left over right upper extremity.  Right lower extremity drift with 45 strength with slight increased tone.  Sensation intact bilaterally.  Plantars downgoing.   Discharge Diet   Heart healthy thin liquids  DISCHARGE PLAN Disposition:  Return home following loop recorder  placement aspirin 81 mg daily and clopidogrel 75 mg daily for secondary stroke prevention for 3 weeks then ASPIRIN alone. Ongoing stroke risk factor control by Primary Care Physician at time of discharge Follow-up  PCP [name], MD in 2 weeks - low magnesium and hypokalemia Follow-up in [facility] in 4 weeks, office to schedule an appointment.  Follow-up [facility] for implantable loop recorder wound check in 10-14 days, office to schedule an appointment.  Loop recorder to be monitored by [facility] for atrial fibrillation as source of stroke. If found, they will notify patient.  35 minutes were spent preparing discharge.  Annie Main, MSN, APRN, ANVP-BC, AGPCNP-BC Advanced Practice Stroke Nurse [facility] See Amion for Schedule & Pager information 05/12/2020 4:41 PM   I have personally obtained history,examined this patient, reviewed notes, independently viewed imaging studies, participated in medical decision making and plan of care.ROS completed by me personally and pertinent positives fully documented  I have made any additions or clarifications directly to the above note. Agree with note above.    Delia Heady, MD Medical Director [facility] Pager: [phone] 05/13/2020 4:12 PM

## 2020-05-12 NOTE — TOC Transition Note (Signed)
Transition of Care [facility]) - CM/SW Discharge Note   Patient Details  Name: Shelby Keller MRN: [id number] Date of Birth: May 12, 1946  [facility] CM/SW Contact:  Kermit Balo, RN Phone Number: 05/12/2020, 3:38 PM   Clinical Narrative:    Pt discharging home with [facility] services through [facility]. Eber Jones with [employer] accepted the referral. Pt has ordered DME at the bedside.  Family is able to provide needed supervision. Daughter will transport home.   Final next level of care: Home w Home Health Services Barriers to Discharge: No Barriers Identified   Patient Goals and CMS Choice   CMS Medicare.gov Compare Post Acute Care list provided to:: Patient Choice offered to / list presented to : Patient  Discharge Placement                       Discharge Plan and Services                DME Arranged: 3-N-1, Walker youth DME Agency: [employer] Date DME Agency Contacted: 05/12/20   Representative spoke with at DME Agency: Velna Hatchet HH Arranged: PT, OT, Speech Therapy, Nurse's Aide HH Agency: [facility] Date [facility] Agency Contacted: 05/12/20   Representative spoke with at [facility] Agency: Eber Jones  Social Determinants of Health (SDOH) Interventions     Readmission Risk Interventions No flowsheet data found.

## 2020-05-12 NOTE — Progress Notes (Signed)
Physical Therapy Treatment Patient Details Name: Shelby Keller MRN: [id number] DOB: 23-Aug-1945 Today's Date: 05/12/2020    History of Present Illness The pt is a 74 yo female presenting with acute R-sided weakness and word finding difficulty. Work up revealed acute infarct left ACA, chronic lacunar infarct.     PT Comments    Pt much improved from eval. Pt functioning at min guard, minA during turns with rolling walker. Pt actively bringing R toes into DF and did not drag R foot one time, was able to clear R foot 100% of time. Pt with slowed processing but able to follow all commands. Acute PT to continue to follow. If patient has 24/7 assist pt can go home with use of RW and HHPT once medically stable.   Follow Up Recommendations  Home health PT;Supervision/Assistance - 24 hour     Equipment Recommendations  Rolling walker with 5" wheels (youth)    Recommendations for Other Services       Precautions / Restrictions Precautions Precautions: Fall Restrictions Weight Bearing Restrictions: No    Mobility  Bed Mobility Overal bed mobility: Needs Assistance Bed Mobility: Supine to Sit     Supine to sit: Supervision     General bed mobility comments: pt brought self to long sit, increased time, used hands to move R LE off EOB, hob elevated  Transfers Overall transfer level: Needs assistance Equipment used: Rolling walker (2 wheeled) Transfers: Sit to/from Stand Sit to Stand: Min guard         General transfer comment: verbal cues for hand placement, increased time, min guard for safety  Ambulation/Gait Ambulation/Gait assistance: Min guard Gait Distance (Feet): 150 Feet Assistive device: Rolling walker (2 wheeled) Gait Pattern/deviations: Step-through pattern;Decreased stride length Gait velocity: decreased   General Gait Details: pt with decreased step length, increased time, pt with occasional running into things on the L, directional verbal cues,   Stairs              Wheelchair Mobility    Modified Rankin (Stroke Patients Only) Modified Rankin (Stroke Patients Only) Pre-Morbid Rankin Score: No symptoms Modified Rankin: Moderately severe disability     Balance Overall balance assessment: Needs assistance Sitting-balance support: No upper extremity supported;Feet supported Sitting balance-Leahy Scale: Good     Standing balance support: Single extremity supported;Bilateral upper extremity supported Standing balance-Leahy Scale: Poor Standing balance comment: reliant on UE support or external assist                             Cognition Arousal/Alertness: Awake/alert Behavior During Therapy: WFL for tasks assessed/performed Overall Cognitive Status: Within Functional Limits for tasks assessed                                 General Comments: pt very HOH that makes following commands a little delayed. Pt able to report "I will need a RW for home". Pt stated why she had a purwick and that she feels she can get to the bathroom if they helped her. spoke with RN tech and asked her to progress her to BSC/bathroom      Exercises      General Comments General comments (skin integrity, edema, etc.): VSS on RA      Pertinent Vitals/Pain Pain Assessment: No/denies pain    Home Living  Prior Function            PT Goals (current goals can now be found in the care plan section) Progress towards PT goals: Progressing toward goals    Frequency    Min 4X/week      PT Plan Discharge plan needs to be updated    Co-evaluation              AM-PAC PT "6 Clicks" Mobility   Outcome Measure  Help needed turning from your back to your side while in a flat bed without using bedrails?: A Little Help needed moving from lying on your back to sitting on the side of a flat bed without using bedrails?: A Little Help needed moving to and from a bed to a chair (including a  wheelchair)?: A Little Help needed standing up from a chair using your arms (e.g., wheelchair or bedside chair)?: A Little Help needed to walk in hospital room?: A Little Help needed climbing 3-5 steps with a railing? : A Little 6 Click Score: 18    End of Session Equipment Utilized During Treatment: Gait belt Activity Tolerance: Patient tolerated treatment well Patient left: in chair;with call bell/phone within reach;with chair alarm set Nurse Communication: Mobility status PT Visit Diagnosis: Unsteadiness on feet (R26.81);Other abnormalities of gait and mobility (R26.89);Other symptoms and signs involving the nervous system (R29.898)     Time: 8032-1224 PT Time Calculation (min) (ACUTE ONLY): 19 min  Charges:  $Gait Training: 8-22 mins                     Lewis Shock, PT, DPT Acute Rehabilitation Services Pager #: [phone] Office #: [phone]    Iona Hansen 05/12/2020, 1:50 PM

## 2020-05-13 ENCOUNTER — Encounter (HOSPITAL_COMMUNITY): Payer: Self-pay | Admitting: Internal Medicine

## 2020-05-27 ENCOUNTER — Ambulatory Visit: Payer: Medicare Other

## 2020-06-16 ENCOUNTER — Ambulatory Visit (INDEPENDENT_AMBULATORY_CARE_PROVIDER_SITE_OTHER): Payer: Medicare Other

## 2020-06-16 DIAGNOSIS — I639 Cerebral infarction, unspecified: Secondary | ICD-10-CM | POA: Diagnosis not present

## 2020-06-16 LAB — CUP PACEART REMOTE DEVICE CHECK
Date Time Interrogation Session: 20220109201700
Implantable Pulse Generator Implant Date: 20211206

## 2020-06-17 ENCOUNTER — Ambulatory Visit: Payer: Medicare Other | Admitting: Adult Health

## 2020-06-17 ENCOUNTER — Encounter: Payer: Self-pay | Admitting: Adult Health

## 2020-06-17 VITALS — BP 116/78 | HR 65 | Ht 61.0 in | Wt 118.0 lb

## 2020-06-17 DIAGNOSIS — I10 Essential (primary) hypertension: Secondary | ICD-10-CM | POA: Diagnosis not present

## 2020-06-17 DIAGNOSIS — E785 Hyperlipidemia, unspecified: Secondary | ICD-10-CM | POA: Diagnosis not present

## 2020-06-17 DIAGNOSIS — I639 Cerebral infarction, unspecified: Secondary | ICD-10-CM

## 2020-06-17 NOTE — Patient Instructions (Addendum)
Continue aspirin 81 mg daily  and atorvastatin 40 mg daily for secondary stroke prevention  Your loop recorder has not shown atrial fibrillation thus far -cardiology will continue to monitor routinely  Continue to follow up with PCP regarding cholesterol and blood pressure management  Maintain strict control of hypertension with blood pressure goal below 130/90 and cholesterol with LDL cholesterol (bad cholesterol) goal below 70 mg/dL.     Followup in the future with me in 3 months or call earlier if needed     Thank you for coming to see Shelby Keller at [facility]. I hope we have been able to provide you high quality care today.  You may receive a patient satisfaction survey over the next few weeks. We would appreciate your feedback and comments so that we may continue to improve ourselves and the health of our patients.

## 2020-06-17 NOTE — Progress Notes (Signed)
I agree with the above plan 

## 2020-06-17 NOTE — Progress Notes (Signed)
[facility] [address] [place]. [place] [address] [phone]       HOSPITAL FOLLOW UP NOTE  [name] Date of Birth:  04-Dec-1945 Medical Record Number:  [id number]   Reason for Referral:  hospital stroke follow up    SUBJECTIVE:   CHIEF COMPLAINT:  Chief Complaint  Patient presents with  . Follow-up    Treatment Room with daughter (Shelby Keller) Pt is well, still weak on right side     HPI:   ShelbyShelby C Langleyis a 75 y.o.femalewith history of degenerative disc disease and thyroid disease,who presented on 05/09/2020 to the emergency room for sudden onset right sided weakness and word finding difficulty.  Personally reviewed hospitalization pertinent progress notes, lab work and imaging summary provided.  Evaluated by Dr. [name] with stroke work-up revealing patchy left MCA infarct s/p tPA, embolic secondary to unknown source.  CTA head/neck showed severe focal stenosis within the mid to distal left P2 left ACA and severe stenosis within the distal M2 left MCA vessel.  Loop recorder placed on 12/6 to rule out atrial fibrillation as potential etiology.  Recommended DAPT for 3 weeks and aspirin alone.  History of HTN on Cozaar and Inderal with BP stable during admission and resumed home meds at discharge.  LDL 182 and initiated atorvastatin 40 mg daily.  Other stroke risk factors include advanced age and chronic lacunar infarct within the right thalamocapsular junction by imaging.  Residual deficits of occasional word finding difficulties, right lower facial weakness and mild right hemiparesis.  Therapies initially recommended discharge to CIR but due to rapid improvement, she was eventually discharged home with recommendation of HH therapies.  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 Mild cerebral atrophy. Moderate small vessel ischemic disease, progressed since MRI 05/13/2011. Chronic R  thalamocapsular junction lacunar infarct   MRI head- patchy left ACA territory infarct. No hemorrhage.  CTA H&N- Severe focal stenosis within the mid to distal A2 left anterior cerebral artery. Severe stenosis within a distal M2 left MCA vessel.   EEG-normal  2D Echo - EF 65 to 70%.No cardiac source of emboli identified.   Loop implant recommended by Dr [name] and place by Dr. Dortha Kern 05/12/2020   Loyal Jacobson Virus 2 - negative  LDL - 182  HgbA1c- 4.7  No antithromboticprior to admission, now on aspirin 81 mg dailyand Plavix 75 mg daily for 3 weeks followed by aspirin alone  Therapy recommendations: CIR-> progressed to [facility] therapy  Disposition: return home  Today, 06/17/2020, Shelby Keller is being seen for hospital follow-up accompanied by her daughter Shelby Keller.  Reports residual deficits of right sided weakness and word finding difficulty which has been slowly improving. Completed home health therapies. She ambulates without AD.  Denies new stroke/TIA symptoms.  Completed 3 weeks DAPT and remains on aspirin alone without bleeding or bruising.  Remains on atorvastatin 40 mg daily without myalgias.  Blood pressure today 116/78.  Loop recorder has not shown atrial fibrillation thus far.  No further concerns at this time.    ROS:   14 system review of systems performed and negative with exception of those listed in HPI  PMH: History reviewed. No pertinent past medical history.  PSH:  Past Surgical History:  Procedure Laterality Date  . BREAST CYST ASPIRATION Right   . LOOP RECORDER INSERTION N/A 05/12/2020   Procedure: LOOP RECORDER INSERTION;  Surgeon: Duke Salvia, MD;  Location: [facility] INVASIVE CV LAB;  Service: Cardiovascular;  Laterality: N/A;    Social History:  Social History   Socioeconomic History  . Marital status: Widowed    Spouse name: Not on file  . Number of children: Not on file  . Years of education: Not on file  . Highest education level: Not  on file  Occupational History  . Not on file  Tobacco Use  . Smoking status: Never Smoker  . Smokeless tobacco: Never Used  Substance and Sexual Activity  . Alcohol use: Never  . Drug use: Never  . Sexual activity: Not on file  Other Topics Concern  . Not on file  Social History Narrative  . Not on file   Social Determinants of Health   Financial Resource Strain: Not on file  Food Insecurity: Not on file  Transportation Needs: Not on file  Physical Activity: Not on file  Stress: Not on file  Social Connections: Not on file  Intimate Partner Violence: Not on file    Family History:  Family History  Problem Relation Age of Onset  . Breast cancer Neg Hx     Medications:   Current Outpatient Medications on File Prior to Visit  Medication Sig Dispense Refill  . aspirin EC 81 MG EC tablet Take 1 tablet (81 mg total) by mouth daily. Swallow whole. 30 tablet 11  . atorvastatin (LIPITOR) 40 MG tablet Take 1 tablet (40 mg total) by mouth daily. 30 tablet 2  . doxylamine, Sleep, (UNISOM) 25 MG tablet Take 25 mg by mouth at bedtime as needed for sleep.    Marland Kitchen levothyroxine (SYNTHROID) 75 MCG tablet Take 75 mcg by mouth daily.     Marland Kitchen losartan (COZAAR) 50 MG tablet Take 50 mg by mouth daily.    Marland Kitchen oxymetazoline (AFRIN) 0.05 % nasal spray Place 1 spray into both nostrils as needed for congestion.    . pantoprazole (PROTONIX) 40 MG tablet Take 40 mg by mouth daily as needed (acid reflux).     Marland Kitchen PARoxetine (PAXIL) 40 MG tablet Take 40 mg by mouth daily.    . propranolol (INDERAL) 10 MG tablet Take 20 mg by mouth 2 (two) times daily.     . potassium chloride SA (KLOR-CON M20) 20 MEQ tablet Take 2 tablets (40 mEq total) by mouth once for 1 dose. TAKE AT LEAST 4 HOURS FOLLOWING YOUR LAST DOSE IN THE HOSPITAL. IF YOU RECEIVED 2 DOSES IN THE HOSPITAL, DO NOT FILL 2 tablet 0   No current facility-administered medications on file prior to visit.    Allergies:   Allergies  Allergen Reactions   . Lisinopril Cough      OBJECTIVE:  Physical Exam  Vitals:   06/17/20 1313  BP: 116/78  Pulse: 65  Weight: 118 lb (53.5 kg)  Height: 5\' 1"  (1.549 m)   Body mass index is 22.3 kg/m. No exam data present  General: well developed, well nourished,  very pleasant elderly Caucasian female, seated, in no evident distress Head: head normocephalic and atraumatic.   Neck: supple with no carotid or supraclavicular bruits Cardiovascular: regular rate and rhythm, no murmurs Musculoskeletal: no deformity Skin:  no rash/petichiae Vascular:  Normal pulses all extremities   Neurologic Exam Mental Status: Awake and fully alert.   Occasional word finding difficulty and mild nonfluent speech.  Able to follow simple step commands and answers questions appropriately.  Oriented to place and time. Recent and remote memory intact. Attention span, concentration and fund of knowledge appropriate. Mood and affect appropriate.  Cranial Nerves: Fundoscopic exam reveals sharp disc margins. Pupils equal, briskly reactive to light. Extraocular movements full without nystagmus. Visual fields full to confrontation. Hearing intact. Facial sensation intact.  Mild right lower facial weakness. tongue, palate moves normally and symmetrically.  Motor: Normal bulk and tone. Normal strength in all tested extremity muscles Sensory.: intact to touch , pinprick , position and vibratory sensation.  Coordination: Rapid alternating movements normal in all extremities. Finger-to-nose and heel-to-shin performed accurately bilaterally. Gait and Station: Arises from chair without difficulty. Stance is normal. Gait demonstrates normal stride length and balance without use of assistive device.  Unable to perform tandem walk and heel toe.  Romberg negative. Reflexes: 1+ and symmetric. Toes downgoing.     NIHSS  2 Modified Rankin  2      ASSESSMENT: Shelby Keller is a 75 y.o. year old female presented with sudden onset  right-sided weakness and word finding difficulty on 05/09/2020 with stroke work-up revealing patchy left MCA infarct s/p tPA, embolic secondary to unknown source s/p loop recorder placement. Vascular risk factors include prior stroke on imaging, HTN, HLD and advanced age.      PLAN:  1. L MCA stroke, cryptogenic:  a. Residual deficit: Mild speech and language impairment but overall improving.  Unable to appreciate residual weakness and denies residual cognitive impairment.  Encouraged continued exercises at home and to call office if she would like to participate in outpatient SLP as currently declining.   b. Continue aspirin 81 mg daily  and atorvastatin 40 mg daily for secondary stroke prevention.   c. Loop recorder has not shown atrial fibrillation thus far -monitored monthly by cardiology.   d. Discussed secondary stroke prevention measures and importance of close PCP follow up for aggressive stroke risk factor management  e. Interested in [place] trial -additional information provided to patient by [facility] research team 2. HTN: BP goal <130/90.  Stable on losartan per PCP 3. HLD: LDL goal <70. Recent LDL 182 therefore initiated atorvastatin 40 mg daily during recent stroke admission.  Request ongoing prescribing and lipid panel monitoring via PCP    Follow up in 3 months or call earlier if needed  CC:  [facility] provider: Dr. Willaim Bane, Merleen Milliner, MD     I spent 45 minutes of face-to-face and non-face-to-face time with patient and daughter.  This included previsit chart review, lab review, study review, order entry, electronic health record documentation, patient and daughter discussion and education regarding recent cryptogenic stroke, ongoing monitoring of loop recorder, [place] trial, residual deficits, importance of managing stroke risk factors and answered all other questions to patient and daughters satisfaction   Ihor Austin, [facility]  [facility] [address] [place], [place] [address]  Phone [phone] Fax [phone] Note: This document was prepared with digital dictation and possible smart phrase technology. Any transcriptional errors that result from this process are unintentional.

## 2020-06-30 NOTE — Progress Notes (Signed)
Carelink Summary Report / Loop Recorder 

## 2020-07-17 ENCOUNTER — Ambulatory Visit (INDEPENDENT_AMBULATORY_CARE_PROVIDER_SITE_OTHER): Payer: Medicare Other

## 2020-07-17 DIAGNOSIS — I639 Cerebral infarction, unspecified: Secondary | ICD-10-CM | POA: Diagnosis not present

## 2020-07-19 LAB — CUP PACEART REMOTE DEVICE CHECK
Date Time Interrogation Session: 20220211201758
Implantable Pulse Generator Implant Date: 20211206

## 2020-07-23 NOTE — Progress Notes (Signed)
Carelink Summary Report / Loop Recorder 

## 2020-08-12 ENCOUNTER — Other Ambulatory Visit: Payer: Self-pay

## 2020-08-12 NOTE — Patient Outreach (Signed)
[facility]) Care Management  08/12/2020  Shelby Keller January 01, 1946 [id number]   Telephone outreach to patient to obtain mRS was successfully completed. MRS=0   Thank you, Vanice Sarah [facility] Care Management Assistant

## 2020-08-18 ENCOUNTER — Ambulatory Visit (INDEPENDENT_AMBULATORY_CARE_PROVIDER_SITE_OTHER): Payer: Medicare Other

## 2020-08-18 DIAGNOSIS — I639 Cerebral infarction, unspecified: Secondary | ICD-10-CM | POA: Diagnosis not present

## 2020-08-21 LAB — CUP PACEART REMOTE DEVICE CHECK
Date Time Interrogation Session: 20220316202150
Implantable Pulse Generator Implant Date: 20211206

## 2020-08-27 NOTE — Progress Notes (Signed)
Carelink Summary Report / Loop Recorder 

## 2020-09-18 ENCOUNTER — Ambulatory Visit (INDEPENDENT_AMBULATORY_CARE_PROVIDER_SITE_OTHER): Payer: Medicare Other

## 2020-09-18 DIAGNOSIS — I639 Cerebral infarction, unspecified: Secondary | ICD-10-CM

## 2020-09-23 LAB — CUP PACEART REMOTE DEVICE CHECK
Date Time Interrogation Session: 20220418201936
Implantable Pulse Generator Implant Date: 20211206

## 2020-09-30 ENCOUNTER — Encounter: Payer: Self-pay | Admitting: Adult Health

## 2020-09-30 ENCOUNTER — Ambulatory Visit: Payer: Medicare Other | Admitting: Adult Health

## 2020-10-06 NOTE — Progress Notes (Signed)
Carelink Summary Report / Loop Recorder 

## 2020-10-20 ENCOUNTER — Ambulatory Visit (INDEPENDENT_AMBULATORY_CARE_PROVIDER_SITE_OTHER): Payer: Medicare Other

## 2020-10-20 DIAGNOSIS — I639 Cerebral infarction, unspecified: Secondary | ICD-10-CM | POA: Diagnosis not present

## 2020-10-28 LAB — CUP PACEART REMOTE DEVICE CHECK
Date Time Interrogation Session: 20220521201831
Implantable Pulse Generator Implant Date: 20211206

## 2020-11-12 NOTE — Progress Notes (Signed)
Carelink Summary Report / Loop Recorder 

## 2020-11-28 ENCOUNTER — Ambulatory Visit (INDEPENDENT_AMBULATORY_CARE_PROVIDER_SITE_OTHER): Payer: Medicare Other

## 2020-11-28 DIAGNOSIS — I639 Cerebral infarction, unspecified: Secondary | ICD-10-CM

## 2020-11-28 LAB — CUP PACEART REMOTE DEVICE CHECK
Date Time Interrogation Session: 20220623202141
Implantable Pulse Generator Implant Date: 20211206

## 2020-12-12 NOTE — Progress Notes (Signed)
Carelink Summary Report / Loop Recorder 

## 2020-12-29 ENCOUNTER — Ambulatory Visit (INDEPENDENT_AMBULATORY_CARE_PROVIDER_SITE_OTHER): Payer: Medicare Other

## 2020-12-29 DIAGNOSIS — I639 Cerebral infarction, unspecified: Secondary | ICD-10-CM | POA: Diagnosis not present

## 2020-12-31 LAB — CUP PACEART REMOTE DEVICE CHECK
Date Time Interrogation Session: 20220726201751
Implantable Pulse Generator Implant Date: 20211206

## 2021-01-23 NOTE — Progress Notes (Signed)
Carelink Summary Report / Loop Recorder 

## 2021-01-29 ENCOUNTER — Ambulatory Visit (INDEPENDENT_AMBULATORY_CARE_PROVIDER_SITE_OTHER): Payer: Medicare Other

## 2021-01-29 DIAGNOSIS — I639 Cerebral infarction, unspecified: Secondary | ICD-10-CM | POA: Diagnosis not present

## 2021-01-31 IMAGING — CT CT HEAD CODE STROKE
4 series · 15 of 47 positions shown, 17 images · non-contrast
Comparison: Brain MRI 05/13/2011.

CLINICAL DATA: Neuro deficit, acute, stroke suspected. Right leg
weakness.

EXAM:
CT HEAD WITHOUT CONTRAST
TECHNIQUE: Contiguous axial images were obtained from the base of the skull
through the vertex without intravenous contrast.

[Series 3: head wo · axial · 0.42mm/px · z∈[+1168,+1294]mm · 7 of 35 slices shown, 9 images]
[im 5/35  brain]
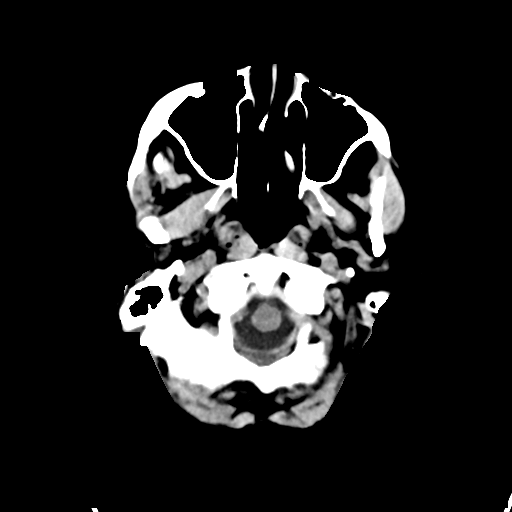
[im 5/35  bone]
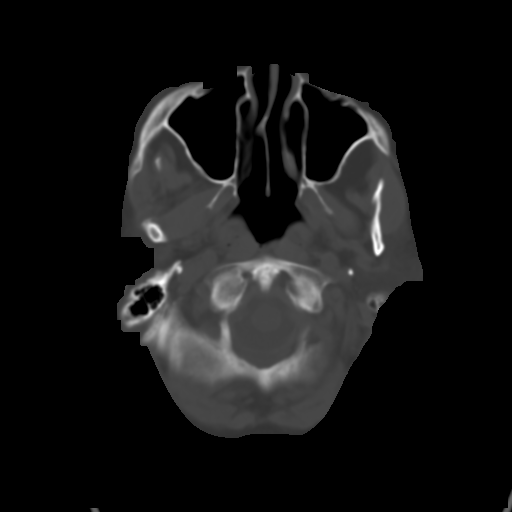
[im 9/35  brain]
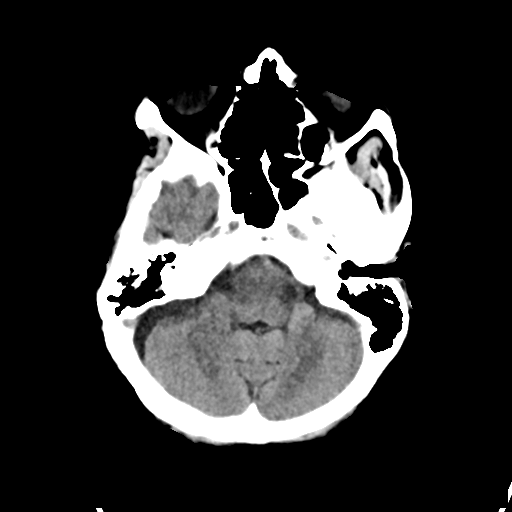
[im 13/35  brain]
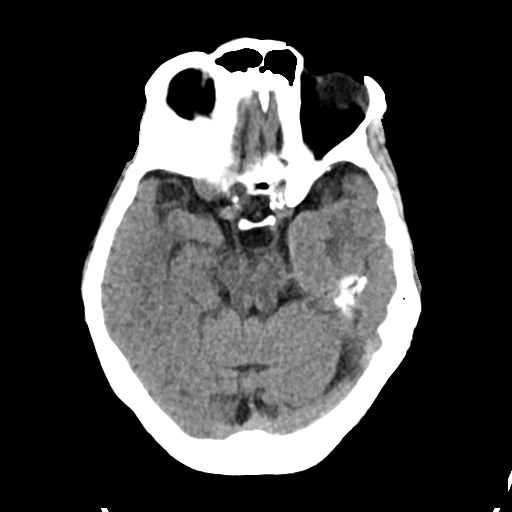
[im 18/35  brain]
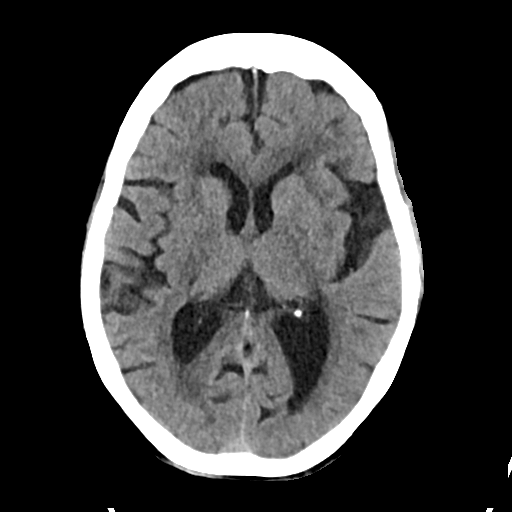
[im 22/35  brain]
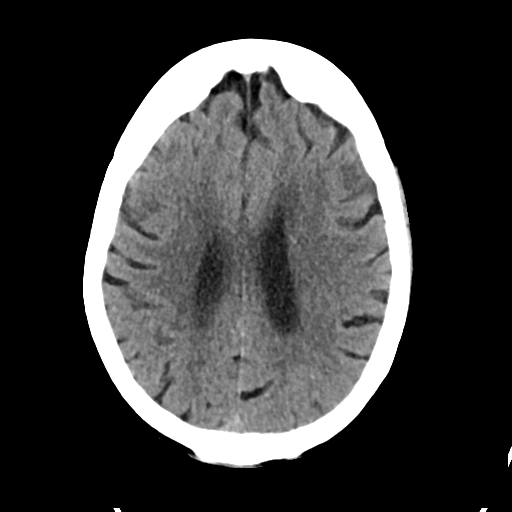
[im 22/35  bone]
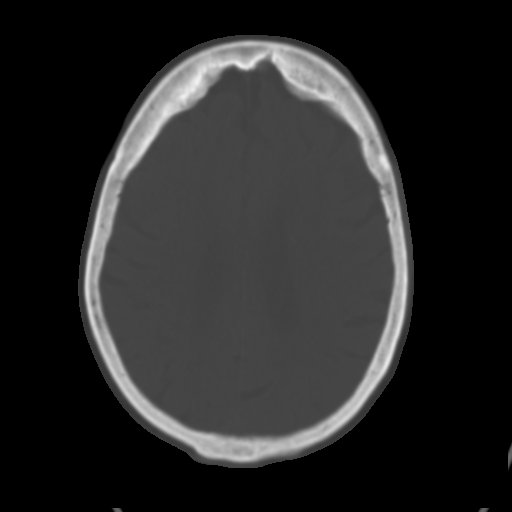
[im 26/35  brain]
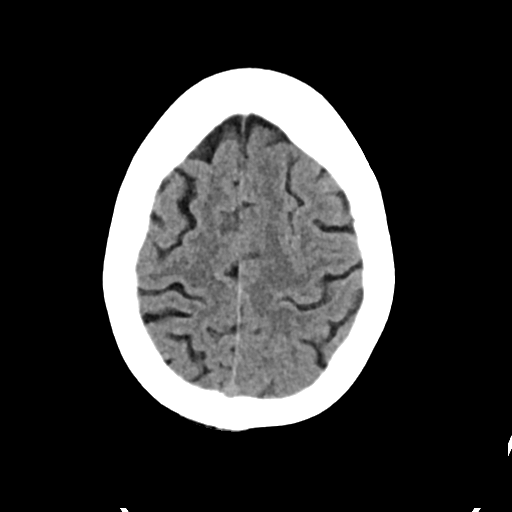
[im 30/35  brain]
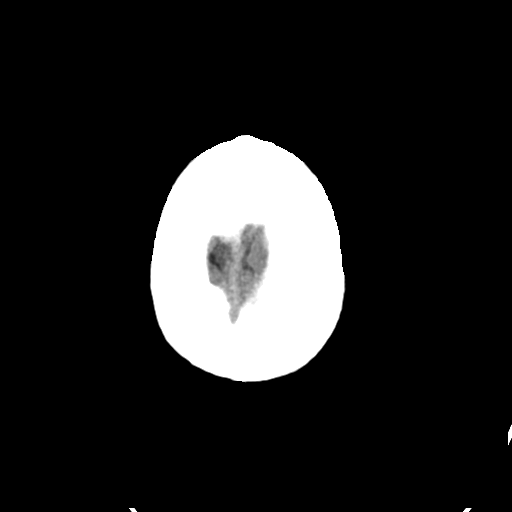

[Series 4: head bone · axial · 0.42mm/px · z∈[+1164,+1182]mm · 2 of 86 slices shown]
[im 9/86  bone]
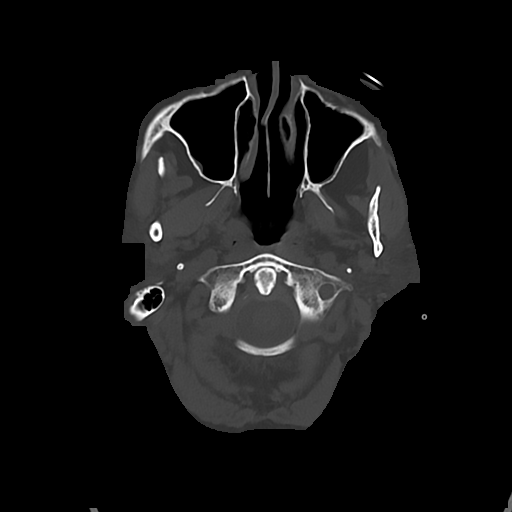
[im 18/86  bone]
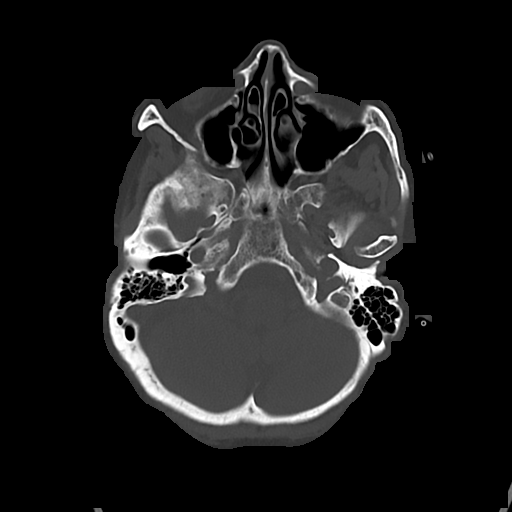

[Series 5: cor soft · coronal · 0.31mm/px · 3 of 82 slices shown]
[im 28/82  brain]
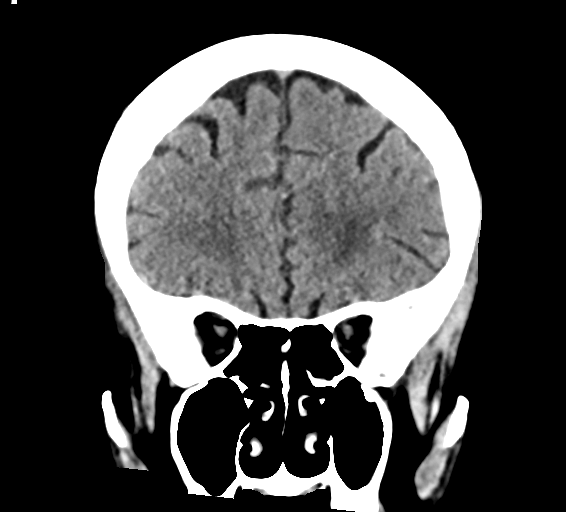
[im 37/82  brain]
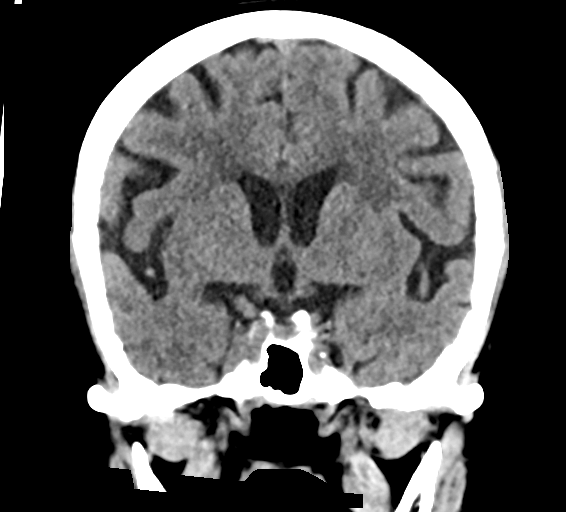
[im 46/82  brain]
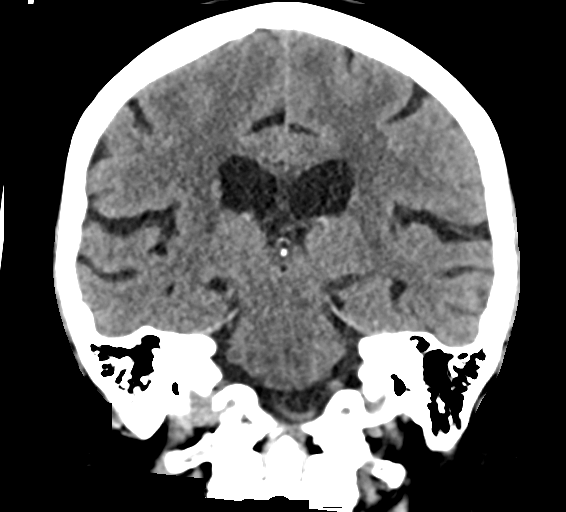

[Series 6: sag soft · sagittal · 0.31mm/px · 3 of 61 slices shown]
[im 21/61  brain]
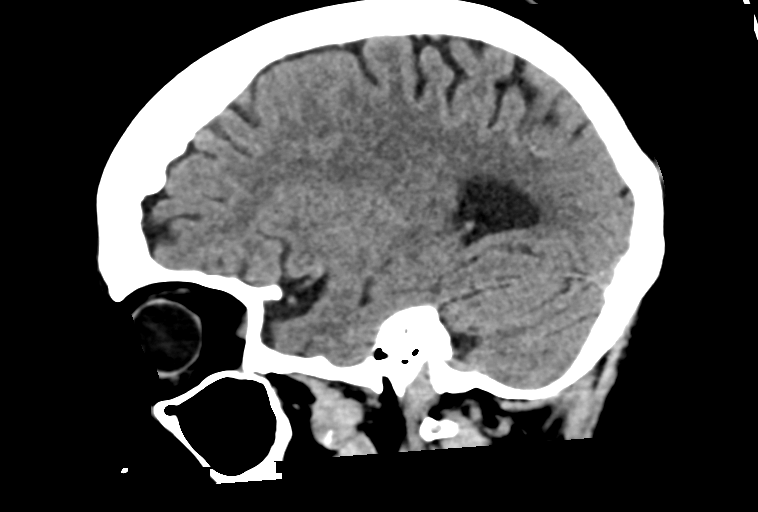
[im 31/61  brain]
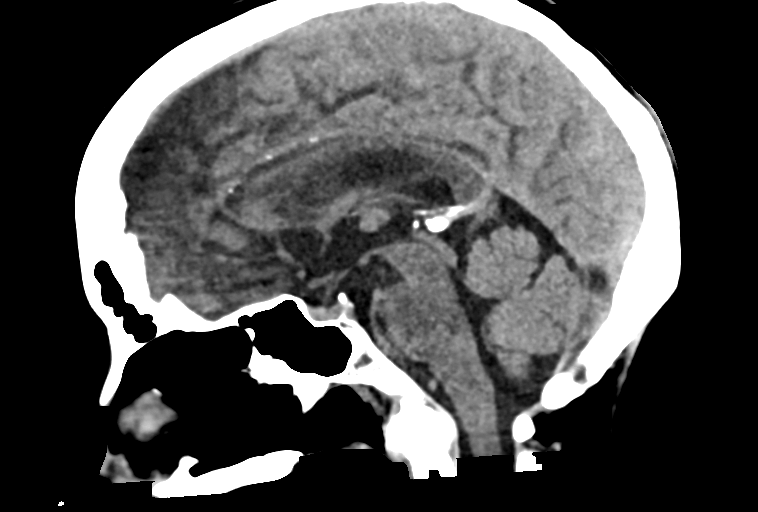
[im 41/61  brain]
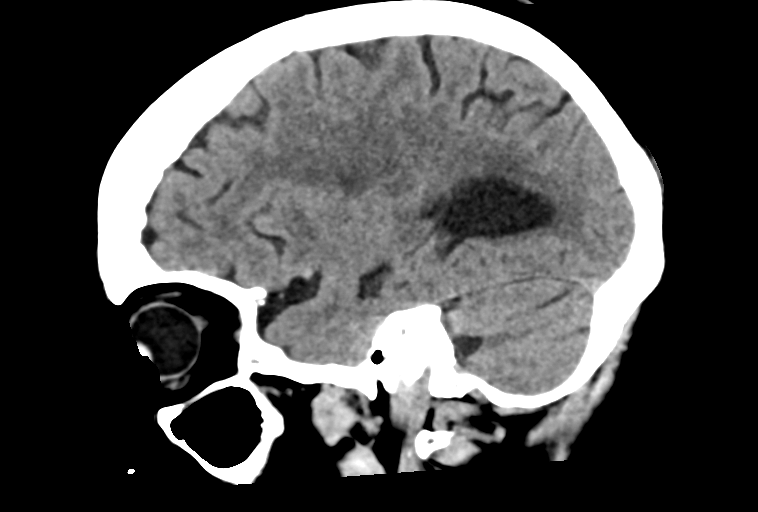

[15 of 47 positions shown; findings below may reference images not displayed]

FINDINGS: Brain:

Mild cerebral atrophy.

Moderate ill-defined hypoattenuation within the cerebral white
matter is nonspecific, but compatible with chronic small vessel
ischemic disease.

Redemonstrated chronic lacunar infarct within the right
thalamocapsular junction.

There is no acute intracranial hemorrhage.

No demarcated cortical infarct.

No extra-axial fluid collection.

No evidence of intracranial mass.

No midline shift.

Vascular: No hyperdense vessel.  Atherosclerotic calcifications.

Skull: Normal. Negative for fracture or focal lesion.

Sinuses/Orbits: Visualized orbits show no acute finding. Mild
ethmoid and maxillary sinus mucosal thickening.

ASPECTS (Alberta Stroke Program Early CT Score)

- Ganglionic level infarction (caudate, lentiform nuclei, internal
capsule, insula, M1-M3 cortex): 7

- Supraganglionic infarction (M4-M6 cortex): 3

Total score (0-10 with 10 being normal): 10

These results were called by telephone at the time of interpretation
on 05/09/2020 at [DATE] to provider Dr. Abramovic Komnenic, who verbally
acknowledged these results.
IMPRESSION: No evidence of acute intracranial abnormality.

Mild cerebral atrophy with moderate chronic small vessel ischemic
disease, progressed as compared to the brain MRI of 05/13/2011.

Redemonstrated chronic lacunar infarct within the right
thalamocapsular junction.

## 2021-02-02 LAB — CUP PACEART REMOTE DEVICE CHECK
Date Time Interrogation Session: 20220828202037
Implantable Pulse Generator Implant Date: 20211206

## 2021-02-12 NOTE — Progress Notes (Signed)
Carelink Summary Report / Loop Recorder 

## 2021-03-09 ENCOUNTER — Ambulatory Visit (INDEPENDENT_AMBULATORY_CARE_PROVIDER_SITE_OTHER): Payer: Medicare Other

## 2021-03-09 DIAGNOSIS — I639 Cerebral infarction, unspecified: Secondary | ICD-10-CM

## 2021-03-09 LAB — CUP PACEART REMOTE DEVICE CHECK
Date Time Interrogation Session: 20220930201907
Implantable Pulse Generator Implant Date: 20211206

## 2021-03-17 NOTE — Progress Notes (Signed)
Carelink Summary Report / Loop Recorder 

## 2021-04-09 LAB — CUP PACEART REMOTE DEVICE CHECK
Date Time Interrogation Session: 20221102201851
Implantable Pulse Generator Implant Date: 20211206

## 2021-04-13 ENCOUNTER — Ambulatory Visit (INDEPENDENT_AMBULATORY_CARE_PROVIDER_SITE_OTHER): Payer: Medicare Other

## 2021-04-13 DIAGNOSIS — I639 Cerebral infarction, unspecified: Secondary | ICD-10-CM

## 2021-04-21 NOTE — Progress Notes (Signed)
Carelink Summary Report / Loop Recorder 

## 2021-05-11 LAB — CUP PACEART REMOTE DEVICE CHECK
Date Time Interrogation Session: 20221205202102
Implantable Pulse Generator Implant Date: 20211206

## 2021-05-18 ENCOUNTER — Ambulatory Visit (INDEPENDENT_AMBULATORY_CARE_PROVIDER_SITE_OTHER): Payer: Medicare Other

## 2021-05-18 DIAGNOSIS — I639 Cerebral infarction, unspecified: Secondary | ICD-10-CM | POA: Diagnosis not present

## 2021-05-28 NOTE — Progress Notes (Signed)
Carelink Summary Report / Loop Recorder 

## 2021-06-22 ENCOUNTER — Ambulatory Visit (INDEPENDENT_AMBULATORY_CARE_PROVIDER_SITE_OTHER): Payer: Medicare Other

## 2021-06-22 DIAGNOSIS — I10 Essential (primary) hypertension: Secondary | ICD-10-CM | POA: Diagnosis not present

## 2021-06-22 LAB — CUP PACEART REMOTE DEVICE CHECK
Date Time Interrogation Session: 20230115230534
Implantable Pulse Generator Implant Date: 20211206

## 2021-07-06 NOTE — Progress Notes (Signed)
Carelink Summary Report / Loop Recorder 

## 2021-07-25 LAB — CUP PACEART REMOTE DEVICE CHECK
Date Time Interrogation Session: 20230217230445
Implantable Pulse Generator Implant Date: 20211206

## 2021-07-27 ENCOUNTER — Ambulatory Visit (INDEPENDENT_AMBULATORY_CARE_PROVIDER_SITE_OTHER): Payer: Medicare Other

## 2021-07-27 DIAGNOSIS — I639 Cerebral infarction, unspecified: Secondary | ICD-10-CM

## 2021-08-03 NOTE — Progress Notes (Signed)
Carelink Summary Report / Loop Recorder 

## 2021-08-31 ENCOUNTER — Ambulatory Visit (INDEPENDENT_AMBULATORY_CARE_PROVIDER_SITE_OTHER): Payer: Medicare Other

## 2021-08-31 DIAGNOSIS — I639 Cerebral infarction, unspecified: Secondary | ICD-10-CM | POA: Diagnosis not present

## 2021-09-01 LAB — CUP PACEART REMOTE DEVICE CHECK
Date Time Interrogation Session: 20230324230714
Implantable Pulse Generator Implant Date: 20211206

## 2021-09-10 NOTE — Progress Notes (Signed)
Carelink Summary Report / Loop Recorder 

## 2021-10-01 LAB — CUP PACEART REMOTE DEVICE CHECK
Date Time Interrogation Session: 20230426230658
Implantable Pulse Generator Implant Date: 20211206

## 2021-10-05 ENCOUNTER — Ambulatory Visit (INDEPENDENT_AMBULATORY_CARE_PROVIDER_SITE_OTHER): Payer: Medicare Other

## 2021-10-05 DIAGNOSIS — I639 Cerebral infarction, unspecified: Secondary | ICD-10-CM | POA: Diagnosis not present

## 2021-10-20 NOTE — Progress Notes (Signed)
Carelink Summary Report / Loop Recorder 

## 2021-11-09 ENCOUNTER — Ambulatory Visit (INDEPENDENT_AMBULATORY_CARE_PROVIDER_SITE_OTHER): Payer: Medicare Other

## 2021-11-09 DIAGNOSIS — I639 Cerebral infarction, unspecified: Secondary | ICD-10-CM | POA: Diagnosis not present

## 2021-11-09 LAB — CUP PACEART REMOTE DEVICE CHECK
Date Time Interrogation Session: 20230529231000
Implantable Pulse Generator Implant Date: 20211206

## 2021-11-26 NOTE — Progress Notes (Signed)
Carelink Summary Report / Loop Recorder 

## 2021-12-11 LAB — CUP PACEART REMOTE DEVICE CHECK
Date Time Interrogation Session: 20230701230739
Implantable Pulse Generator Implant Date: 20211206

## 2021-12-14 ENCOUNTER — Ambulatory Visit (INDEPENDENT_AMBULATORY_CARE_PROVIDER_SITE_OTHER): Payer: Medicare Other

## 2021-12-14 DIAGNOSIS — I639 Cerebral infarction, unspecified: Secondary | ICD-10-CM

## 2022-01-08 LAB — CUP PACEART REMOTE DEVICE CHECK
Date Time Interrogation Session: 20230803230546
Implantable Pulse Generator Implant Date: 20211206

## 2022-01-11 ENCOUNTER — Ambulatory Visit (INDEPENDENT_AMBULATORY_CARE_PROVIDER_SITE_OTHER): Payer: Medicare Other

## 2022-01-11 DIAGNOSIS — I639 Cerebral infarction, unspecified: Secondary | ICD-10-CM | POA: Diagnosis not present

## 2022-01-12 NOTE — Progress Notes (Signed)
Carelink Summary Report / Loop Recorder 

## 2022-02-10 LAB — CUP PACEART REMOTE DEVICE CHECK
Date Time Interrogation Session: 20230905230404
Implantable Pulse Generator Implant Date: 20211206

## 2022-02-11 NOTE — Progress Notes (Signed)
Carelink Summary Report / Loop Recorder 

## 2022-02-15 ENCOUNTER — Ambulatory Visit (INDEPENDENT_AMBULATORY_CARE_PROVIDER_SITE_OTHER): Payer: Medicare Other

## 2022-02-15 DIAGNOSIS — I639 Cerebral infarction, unspecified: Secondary | ICD-10-CM | POA: Diagnosis not present

## 2022-03-04 NOTE — Progress Notes (Signed)
Carelink Summary Report / Loop Recorder 

## 2022-03-22 ENCOUNTER — Ambulatory Visit (INDEPENDENT_AMBULATORY_CARE_PROVIDER_SITE_OTHER): Payer: Medicare Other

## 2022-03-22 DIAGNOSIS — I639 Cerebral infarction, unspecified: Secondary | ICD-10-CM | POA: Diagnosis not present

## 2022-03-22 LAB — CUP PACEART REMOTE DEVICE CHECK
Date Time Interrogation Session: 20231008230542
Implantable Pulse Generator Implant Date: 20211206

## 2022-04-12 NOTE — Progress Notes (Signed)
Carelink Summary Report / Loop Recorder 

## 2022-04-26 ENCOUNTER — Ambulatory Visit (INDEPENDENT_AMBULATORY_CARE_PROVIDER_SITE_OTHER): Payer: Medicare Other

## 2022-04-26 DIAGNOSIS — I639 Cerebral infarction, unspecified: Secondary | ICD-10-CM | POA: Diagnosis not present

## 2022-04-27 LAB — CUP PACEART REMOTE DEVICE CHECK
Date Time Interrogation Session: 20231119230722
Implantable Pulse Generator Implant Date: 20211206

## 2022-05-06 ENCOUNTER — Emergency Department
Admission: EM | Admit: 2022-05-06 | Discharge: 2022-05-07 | Discharge status: Against Medical Advice | Payer: Medicare Other | Attending: Emergency Medicine | Admitting: Emergency Medicine

## 2022-05-06 ENCOUNTER — Other Ambulatory Visit: Payer: Self-pay

## 2022-05-06 DIAGNOSIS — R531 Weakness: Secondary | ICD-10-CM | POA: Insufficient documentation

## 2022-05-06 DIAGNOSIS — Z5321 Procedure and treatment not carried out due to patient leaving prior to being seen by health care provider: Secondary | ICD-10-CM | POA: Insufficient documentation

## 2022-05-06 LAB — CBC
HCT: 34.1 % — ABNORMAL LOW (ref 36.0–46.0)
Hemoglobin: 11.9 g/dL — ABNORMAL LOW (ref 12.0–15.0)
MCH: 31.7 pg (ref 26.0–34.0)
MCHC: 34.9 g/dL (ref 30.0–36.0)
MCV: 90.9 fL (ref 80.0–100.0)
Platelets: 143 10*3/uL — ABNORMAL LOW (ref 150–400)
RBC: 3.75 MIL/uL — ABNORMAL LOW (ref 3.87–5.11)
RDW: 12.1 % (ref 11.5–15.5)
WBC: 3.2 10*3/uL — ABNORMAL LOW (ref 4.0–10.5)
nRBC: 0 % (ref 0.0–0.2)

## 2022-05-06 LAB — URINALYSIS, ROUTINE W REFLEX MICROSCOPIC
Bilirubin Urine: NEGATIVE
Glucose, UA: NEGATIVE mg/dL
Ketones, ur: 5 mg/dL — AB
Leukocytes,Ua: NEGATIVE
Nitrite: NEGATIVE
Protein, ur: 30 mg/dL — AB
Specific Gravity, Urine: 1.024 (ref 1.005–1.030)
pH: 5 (ref 5.0–8.0)

## 2022-05-06 LAB — BASIC METABOLIC PANEL
Anion gap: 9 (ref 5–15)
BUN: 17 mg/dL (ref 8–23)
CO2: 23 mmol/L (ref 22–32)
Calcium: 9.1 mg/dL (ref 8.9–10.3)
Chloride: 105 mmol/L (ref 98–111)
Creatinine, Ser: 1.09 mg/dL — ABNORMAL HIGH (ref 0.44–1.00)
GFR, Estimated: 53 mL/min — ABNORMAL LOW (ref >60)
Glucose, Bld: 143 mg/dL — ABNORMAL HIGH (ref 70–99)
Potassium: 3.2 mmol/L — ABNORMAL LOW (ref 3.5–5.1)
Sodium: 137 mmol/L (ref 135–145)

## 2022-05-06 LAB — CBG MONITORING, ED: Glucose-Capillary: 120 mg/dL — ABNORMAL HIGH (ref 70–99)

## 2022-05-06 NOTE — ED Triage Notes (Addendum)
Pt comes from home via POV c/o weakness. Pt's daughter states she has slipped onto the floor twice and had to be picked up by family. Pt just states she is sore. Pt A&O x 4 and answering all questions. Pt very hard of hearing. Pt has hx of stroke about 2 years ago, with some right sided deficits. No new deficits at this time.

## 2022-05-08 ENCOUNTER — Observation Stay
Admission: EM | Admit: 2022-05-08 | Discharge: 2022-05-09 | Disposition: A | Discharge status: Home / Self Care | Payer: Medicare Other | Attending: Internal Medicine | Admitting: Internal Medicine

## 2022-05-08 ENCOUNTER — Emergency Department: Payer: Medicare Other

## 2022-05-08 DIAGNOSIS — R55 Syncope and collapse: Principal | ICD-10-CM | POA: Insufficient documentation

## 2022-05-08 DIAGNOSIS — D696 Thrombocytopenia, unspecified: Secondary | ICD-10-CM | POA: Diagnosis present

## 2022-05-08 DIAGNOSIS — E876 Hypokalemia: Secondary | ICD-10-CM | POA: Diagnosis present

## 2022-05-08 DIAGNOSIS — M6281 Muscle weakness (generalized): Secondary | ICD-10-CM | POA: Diagnosis not present

## 2022-05-08 DIAGNOSIS — J101 Influenza due to other identified influenza virus with other respiratory manifestations: Secondary | ICD-10-CM | POA: Diagnosis not present

## 2022-05-08 DIAGNOSIS — Z8673 Personal history of transient ischemic attack (TIA), and cerebral infarction without residual deficits: Secondary | ICD-10-CM

## 2022-05-08 DIAGNOSIS — R42 Dizziness and giddiness: Secondary | ICD-10-CM

## 2022-05-08 DIAGNOSIS — I10 Essential (primary) hypertension: Secondary | ICD-10-CM | POA: Diagnosis present

## 2022-05-08 DIAGNOSIS — Z7982 Long term (current) use of aspirin: Secondary | ICD-10-CM | POA: Insufficient documentation

## 2022-05-08 DIAGNOSIS — R7989 Other specified abnormal findings of blood chemistry: Secondary | ICD-10-CM | POA: Diagnosis not present

## 2022-05-08 DIAGNOSIS — E860 Dehydration: Secondary | ICD-10-CM | POA: Diagnosis not present

## 2022-05-08 DIAGNOSIS — E039 Hypothyroidism, unspecified: Secondary | ICD-10-CM | POA: Diagnosis not present

## 2022-05-08 DIAGNOSIS — N179 Acute kidney failure, unspecified: Secondary | ICD-10-CM | POA: Diagnosis not present

## 2022-05-08 DIAGNOSIS — Z1152 Encounter for screening for COVID-19: Secondary | ICD-10-CM | POA: Insufficient documentation

## 2022-05-08 DIAGNOSIS — Z79899 Other long term (current) drug therapy: Secondary | ICD-10-CM | POA: Diagnosis not present

## 2022-05-08 DIAGNOSIS — R2681 Unsteadiness on feet: Secondary | ICD-10-CM | POA: Diagnosis not present

## 2022-05-08 DIAGNOSIS — R531 Weakness: Secondary | ICD-10-CM

## 2022-05-08 LAB — CBG MONITORING, ED: Glucose-Capillary: 110 mg/dL — ABNORMAL HIGH (ref 70–99)

## 2022-05-08 LAB — CBC
HCT: 36.5 % (ref 36.0–46.0)
Hemoglobin: 12.4 g/dL (ref 12.0–15.0)
MCH: 31.6 pg (ref 26.0–34.0)
MCHC: 34 g/dL (ref 30.0–36.0)
MCV: 93.1 fL (ref 80.0–100.0)
Platelets: 146 10*3/uL — ABNORMAL LOW (ref 150–400)
RBC: 3.92 MIL/uL (ref 3.87–5.11)
RDW: 12.4 % (ref 11.5–15.5)
WBC: 4.3 10*3/uL (ref 4.0–10.5)
nRBC: 0 % (ref 0.0–0.2)

## 2022-05-08 LAB — RESP PANEL BY RT-PCR (FLU A&B, COVID) ARPGX2
Influenza A by PCR: POSITIVE — AB
Influenza B by PCR: NEGATIVE
SARS Coronavirus 2 by RT PCR: NEGATIVE

## 2022-05-08 LAB — BASIC METABOLIC PANEL
Anion gap: 10 (ref 5–15)
BUN: 27 mg/dL — ABNORMAL HIGH (ref 8–23)
CO2: 24 mmol/L (ref 22–32)
Calcium: 9.1 mg/dL (ref 8.9–10.3)
Chloride: 106 mmol/L (ref 98–111)
Creatinine, Ser: 1.67 mg/dL — ABNORMAL HIGH (ref 0.44–1.00)
GFR, Estimated: 32 mL/min — ABNORMAL LOW (ref >60)
Glucose, Bld: 130 mg/dL — ABNORMAL HIGH (ref 70–99)
Potassium: 3.1 mmol/L — ABNORMAL LOW (ref 3.5–5.1)
Sodium: 140 mmol/L (ref 135–145)

## 2022-05-08 LAB — TROPONIN I (HIGH SENSITIVITY)
Troponin I (High Sensitivity): 76 ng/L — ABNORMAL HIGH (ref <18)
Troponin I (High Sensitivity): 72 ng/L — ABNORMAL HIGH (ref <18)

## 2022-05-08 MED ORDER — OSELTAMIVIR PHOSPHATE 30 MG PO CAPS
30.0000 mg | ORAL_CAPSULE | Freq: Every day | ORAL | Status: DC
  Administered 2022-05-09: 30 mg via ORAL
  Filled 2022-05-08: qty 1

## 2022-05-08 MED ORDER — POTASSIUM CHLORIDE CRYS ER 20 MEQ PO TBCR
40.0000 meq | EXTENDED_RELEASE_TABLET | Freq: Once | ORAL | Status: AC
  Administered 2022-05-08: 40 meq via ORAL
  Filled 2022-05-08: qty 2

## 2022-05-08 MED ORDER — ENOXAPARIN SODIUM 30 MG/0.3ML IJ SOSY: 30.0000 mg | PREFILLED_SYRINGE | INTRAMUSCULAR | Status: DC

## 2022-05-08 MED ORDER — PANTOPRAZOLE SODIUM 40 MG PO TBEC: 40.0000 mg | DELAYED_RELEASE_TABLET | Freq: Every day | ORAL | Status: DC | PRN

## 2022-05-08 MED ORDER — ACETAMINOPHEN 325 MG PO TABS
650.0000 mg | ORAL_TABLET | Freq: Four times a day (QID) | ORAL | Status: DC | PRN
  Administered 2022-05-08: 650 mg via ORAL
  Filled 2022-05-08: qty 2

## 2022-05-08 MED ORDER — ONDANSETRON HCL 4 MG PO TABS: 4.0000 mg | ORAL_TABLET | Freq: Four times a day (QID) | ORAL | Status: DC | PRN

## 2022-05-08 MED ORDER — ONDANSETRON HCL 4 MG/2ML IJ SOLN: 4.0000 mg | Freq: Four times a day (QID) | INTRAMUSCULAR | Status: DC | PRN

## 2022-05-08 MED ORDER — OSELTAMIVIR PHOSPHATE 30 MG PO CAPS
30.0000 mg | ORAL_CAPSULE | Freq: Once | ORAL | Status: AC
  Administered 2022-05-08: 30 mg via ORAL
  Filled 2022-05-08: qty 1

## 2022-05-08 MED ORDER — ACETAMINOPHEN 325 MG RE SUPP: 650.0000 mg | Freq: Four times a day (QID) | RECTAL | Status: DC | PRN

## 2022-05-08 MED ORDER — PAROXETINE HCL 20 MG PO TABS
40.0000 mg | ORAL_TABLET | Freq: Every day | ORAL | Status: DC
  Administered 2022-05-09: 40 mg via ORAL
  Filled 2022-05-08: qty 2

## 2022-05-08 MED ORDER — LEVOTHYROXINE SODIUM 50 MCG PO TABS
75.0000 ug | ORAL_TABLET | Freq: Every day | ORAL | Status: DC
  Administered 2022-05-09: 75 ug via ORAL
  Filled 2022-05-08: qty 2

## 2022-05-08 MED ORDER — SODIUM CHLORIDE 0.9 % IV SOLN: INTRAVENOUS | Status: AC

## 2022-05-08 MED ORDER — ATORVASTATIN CALCIUM 20 MG PO TABS
40.0000 mg | ORAL_TABLET | Freq: Every day | ORAL | Status: DC
  Administered 2022-05-09: 40 mg via ORAL
  Filled 2022-05-08: qty 2

## 2022-05-08 MED ORDER — SODIUM CHLORIDE 0.9 % IV BOLUS
500.0000 mL | Freq: Once | INTRAVENOUS | Status: AC
  Administered 2022-05-08: 500 mL via INTRAVENOUS

## 2022-05-08 MED ORDER — ENOXAPARIN SODIUM 40 MG/0.4ML IJ SOSY: 40.0000 mg | PREFILLED_SYRINGE | INTRAMUSCULAR | Status: DC

## 2022-05-08 MED ORDER — ASPIRIN 81 MG PO TBEC
81.0000 mg | DELAYED_RELEASE_TABLET | Freq: Every day | ORAL | Status: DC
  Administered 2022-05-09: 81 mg via ORAL
  Filled 2022-05-08: qty 1

## 2022-05-08 MED ORDER — DOXYLAMINE SUCCINATE (SLEEP) 25 MG PO TABS: 25.0000 mg | ORAL_TABLET | Freq: Every evening | ORAL | Status: DC | PRN

## 2022-05-08 NOTE — ED Provider Notes (Signed)
[facility] Provider Note    Event Date/Time   First MD Initiated Contact with Patient 05/08/22 2020     (approximate)   History   Weakness   HPI  Shelby Keller is a 76 y.o. female who comes in with concerns for weakness.  Patient had decreased appetite, cough, congestion for the past few weeks and the family members have been positive for the flu.  Patient reports that she had 2 episodes of syncope today.  According to the daughter the first 1 she was with her and she was walking and then she said she was going to pass out and she was able to catch her and lay her down on the ground.  The second episode was a near syncopal episode where she almost passed out again but did not fully lose consciousness.  They were concerned due to these multiple episodes.  She did not hit her head denies any abdominal pain but is had some nausea vomiting diarrhea.   Physical Exam   Triage Vital Signs: ED Triage Vitals  Enc Vitals Group     BP 05/08/22 1854 102/69     Pulse Rate 05/08/22 1854 61     Resp 05/08/22 1854 18     Temp 05/08/22 1854 98.5 F (36.9 C)     Temp Source 05/08/22 1854 Oral     SpO2 05/08/22 1854 95 %     Weight 05/08/22 1908 119 lb (54 kg)     Height 05/08/22 1908 5\' 2"  (1.575 m)     Head Circumference --      Peak Flow --      Pain Score 05/08/22 1855 9     Pain Loc --      Pain Edu? --      Excl. in GC? --     Most recent vital signs: Vitals:   05/08/22 1854  BP: 102/69  Pulse: 61  Resp: 18  Temp: 98.5 F (36.9 C)  SpO2: 95%     General: Awake, no distress.  CV:  Good peripheral perfusion.  Resp:  Normal effort.  Abd:  No distention.  Soft nontender Other:  No hematoma noted to the head   ED Results / Procedures / Treatments   Labs (all labs ordered are listed, but only abnormal results are displayed) Labs Reviewed  RESP PANEL BY RT-PCR (FLU A&B, COVID) ARPGX2 - Abnormal; Notable for the following components:      Result  Value   Influenza A by PCR POSITIVE (*)    All other components within normal limits  BASIC METABOLIC PANEL - Abnormal; Notable for the following components:   Potassium 3.1 (*)    Glucose, Bld 130 (*)    BUN 27 (*)    Creatinine, Ser 1.67 (*)    GFR, Estimated 32 (*)    All other components within normal limits  CBC - Abnormal; Notable for the following components:   Platelets 146 (*)    All other components within normal limits  CBG MONITORING, ED - Abnormal; Notable for the following components:   Glucose-Capillary 110 (*)    All other components within normal limits  TROPONIN I (HIGH SENSITIVITY) - Abnormal; Notable for the following components:   Troponin I (High Sensitivity) 76 (*)    All other components within normal limits  URINALYSIS, ROUTINE W REFLEX MICROSCOPIC  TROPONIN I (HIGH SENSITIVITY)     EKG  My interpretation of EKG:  Normal sinus rate of  63 no ST elevation, no T wave versions, normal intervals  RADIOLOGY I have reviewed the xray personally and interpreted and no pneumonia  PROCEDURES:  Critical Care performed: No  Procedures   MEDICATIONS ORDERED IN ED: Medications  sodium chloride 0.9 % bolus 500 mL (500 mLs Intravenous New Bag/Given 05/08/22 2108)  potassium chloride SA (KLOR-CON M) CR tablet 40 mEq (40 mEq Oral Given 05/08/22 2110)     IMPRESSION / MDM / ASSESSMENT AND PLAN / ED COURSE  I reviewed the triage vital signs and the nursing notes.   Patient's presentation is most consistent with acute presentation with potential threat to life or bodily function.   Patient comes in with the flu and weakness.  Labs ordered evaluate for Electra abnormalities, AKI.  Given syncopal episode EKG cardiac markers ordered.  Glucose is normal.  BMP shows slightly elevated creatinine and slightly low potassium.  CBC is stable platelets.  Troponin slightly elevated.  Flu test is positive  Suspect that the syncopal episode was most likely from dehydration  but given troponin is elevated we discussed going home versus admission given the multiple episodes of passing out in the setting of the flu and patient's age for cardiac monitoring, echocardiogram, fluids.  They would prefer patient to be admitted given multiple episodes of syncope versus near syncopal episodes.      FINAL CLINICAL IMPRESSION(S) / ED DIAGNOSES   Final diagnoses:  Influenza A  Weakness  Dehydration  AKI (acute kidney injury) (HCC)  Syncope, unspecified syncope type     Rx / DC Orders   ED Discharge Orders     None        Note:  This document was prepared using Dragon voice recognition software and may include unintentional dictation errors.   Concha Se, MD 05/08/22 2129

## 2022-05-08 NOTE — Assessment & Plan Note (Signed)
Likely prerenal related to poor oral intake in the setting of acute illness IV hydration and avoid nephrotoxins

## 2022-05-08 NOTE — Assessment & Plan Note (Signed)
Secondary to acute illness Can consider PT evaluation

## 2022-05-08 NOTE — Assessment & Plan Note (Signed)
Hold antihypertensives of losartan and propranolol as BP on arrival was soft at 102/69.  Resume as appropriate

## 2022-05-08 NOTE — Assessment & Plan Note (Signed)
Platelet count 1 46,000

## 2022-05-08 NOTE — Assessment & Plan Note (Signed)
Received oral repletion of 40 mEq in the ED Continue to monitor

## 2022-05-08 NOTE — Assessment & Plan Note (Signed)
Troponin 72 but without complaints of chest pain and EKG nonacute Suspect demand ischemia We will continue to trend

## 2022-05-08 NOTE — H&P (Signed)
History and Physical    Patient: Shelby Keller ASN:[id number] DOB: [birth year] DOA: 05/08/2022 DOS: the patient was seen and examined on 05/08/2022 PCP: [name], MD  Patient coming from: Home  Chief Complaint:  Chief Complaint  Patient presents with   Weakness    HPI: Shelby Keller is a 76 y.o. 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  She returns to the ER due to persistent symptoms.  Daughter reported that members of the household have influenza A.  Patient has had no vomiting or diarrhea or dysuria no fevers or chills and has no complaints of chest pain or shortness of breath. ED course and data review: BP 102/69 with otherwise normal vitals.  Labs significant for normal WBC and hemoglobin, slightly low platelets of 1 46,000.  Potassium 3.1, creatinine 1.67 up from baseline of 0.93.  Influenza A+.  Troponin 72.  EKG, personally viewed and interpreted shows sinus rhythm at 63 with no ischemic ST-T wave changes.  Chest x-ray clear. Patient treated with a NS bolus given a dose of oral potassium and started on Tamiflu 30 mg adjusted for kidney function.  Hospitalist consulted for admission.   Review of Systems: As mentioned in the history of present illness. All other systems reviewed and are negative.  No past medical history on file. Past Surgical History:  Procedure Laterality Date   BREAST CYST ASPIRATION Right    LOOP RECORDER INSERTION N/A 05/12/2020   Procedure: LOOP RECORDER INSERTION;  Surgeon: Duke Salvia, MD;  Location: [facility] INVASIVE CV LAB;  Service: Cardiovascular;  Laterality: N/A;   Social  History:  reports that she has never smoked. She has never used smokeless tobacco. She reports that she does not drink alcohol and does not use drugs.  Allergies  Allergen Reactions   Lisinopril Cough    Family History  Problem Relation Age of Onset   Breast cancer Neg Hx     Prior to Admission medications   Medication Sig Start Date End Date Taking? Authorizing Provider  aspirin EC 81 MG EC tablet Take 1 tablet (81 mg total) by mouth daily. Swallow whole. 05/13/20  Yes Layne Benton, NP  atorvastatin (LIPITOR) 40 MG tablet Take 1 tablet (40 mg total) by mouth daily. 05/13/20  Yes Layne Benton, NP  levothyroxine (SYNTHROID) 75 MCG tablet Take 75 mcg by mouth daily.  05/06/20  Yes [provider]  losartan (COZAAR) 50 MG tablet Take 50 mg by mouth daily. 01/09/20  Yes [provider]  PARoxetine (PAXIL) 40 MG tablet Take 40 mg by mouth daily. 05/06/20  Yes [provider]  propranolol (INDERAL) 10 MG tablet Take 20 mg by mouth 2 (two) times daily.  01/09/20  Yes [provider]  doxylamine, Sleep, (UNISOM) 25 MG tablet Take 25 mg by mouth at bedtime as needed for sleep.    [provider]  oxymetazoline (AFRIN) 0.05 % nasal spray Place 1 spray into both nostrils as needed for congestion.    [provider]  pantoprazole (PROTONIX) 40 MG tablet Take 40 mg by mouth daily as needed (acid reflux).  01/14/20  [provider]  potassium chloride SA (KLOR-CON M20) 20 MEQ tablet Take 2 tablets (40 mEq total) by mouth once for 1 dose. TAKE AT LEAST 4 HOURS FOLLOWING YOUR LAST DOSE IN THE HOSPITAL. IF YOU RECEIVED 2 DOSES IN THE HOSPITAL, DO NOT FILL 05/12/20 05/12/20  Layne Benton, NP    Physical Exam: Vitals:   05/08/22 1854 05/08/22 1908  BP: 102/69   Pulse: 61   Resp: 18   Temp: 98.5 F (36.9 C)   TempSrc: Oral   SpO2: 95%   Weight:  54 kg  Height:  5\' 2"  (1.575 m)   Physical Exam Vitals and nursing note reviewed.   Constitutional:      General: She is not in acute distress.    Appearance: She is ill-appearing.  HENT:     Head: Normocephalic and atraumatic.  Cardiovascular:     Rate and Rhythm: Normal rate and regular rhythm.     Heart sounds: Normal heart sounds.  Pulmonary:     Effort: Pulmonary effort is normal.     Breath sounds: Normal breath sounds.  Abdominal:     Palpations: Abdomen is soft.     Tenderness: There is no abdominal tenderness.  Neurological:     General: No focal deficit present.     Labs on Admission: I have personally reviewed following labs and imaging studies  CBC: Recent Labs  Lab 05/06/22 2121 05/08/22 1908  WBC 3.2* 4.3  HGB 11.9* 12.4  HCT 34.1* 36.5  MCV 90.9 93.1  PLT 143* 146*   Basic Metabolic Panel: Recent Labs  Lab 05/06/22 2121 05/08/22 1908  NA 137 140  K 3.2* 3.1*  CL 105 106  CO2 23 24  GLUCOSE 143* 130*  BUN 17 27*  CREATININE 1.09* 1.67*  CALCIUM 9.1 9.1   GFR: Estimated Creatinine Clearance: 22.7 mL/min (A) (by C-G formula based on SCr of 1.67 mg/dL (H)). Liver Function Tests: No results for input(s): "AST", "ALT", "ALKPHOS", "BILITOT", "PROT", "ALBUMIN" in the last 168 hours. No results for input(s): "LIPASE", "AMYLASE" in the last 168 hours. No results for input(s): "AMMONIA" in the last 168 hours. Coagulation Profile: No results for input(s): "INR", "PROTIME" in the last 168 hours. Cardiac Enzymes: No results for input(s): "CKTOTAL", "CKMB", "CKMBINDEX", "TROPONINI" in the last 168 hours. BNP (last 3 results) No results for input(s): "PROBNP" in the last 8760 hours. HbA1C: No results for input(s): "HGBA1C" in the last 72 hours. CBG: Recent Labs  Lab 05/06/22 2137 05/08/22 1913  GLUCAP 120* 110*   Lipid Profile: No results for input(s): "CHOL", "HDL", "LDLCALC", "TRIG", "CHOLHDL", "LDLDIRECT" in the last 72 hours. Thyroid Function Tests: No results for input(s): "TSH", "T4TOTAL", "FREET4", "T3FREE", "THYROIDAB"  in the last 72 hours. Anemia Panel: No results for input(s): "VITAMINB12", "FOLATE", "FERRITIN", "TIBC", "IRON", "RETICCTPCT" in the last 72 hours. Urine analysis:    Component Value Date/Time   COLORURINE YELLOW (A) 05/06/2022 2121   APPEARANCEUR HAZY (A) 05/06/2022 2121   LABSPEC 1.024 05/06/2022 2121   PHURINE 5.0 05/06/2022 2121   GLUCOSEU NEGATIVE 05/06/2022 2121   HGBUR MODERATE (A) 05/06/2022 2121   BILIRUBINUR NEGATIVE 05/06/2022 2121   KETONESUR 5 (A) 05/06/2022 2121   PROTEINUR 30 (A) 05/06/2022 2121   NITRITE NEGATIVE 05/06/2022 2121   LEUKOCYTESUR NEGATIVE 05/06/2022 2121    Radiological Exams on Admission: DG Chest 2 View  Result Date: 05/08/2022 CLINICAL DATA:  Cough, chest congestion EXAM: CHEST - 2 VIEW COMPARISON:  None Available. FINDINGS: Cardiac  size is within normal limits. Increase in AP diameter of chest suggests COPD. There are no signs of pulmonary edema or focal pulmonary consolidation. There is no pleural effusion or pneumothorax. There is implantable cardiac monitoring device in left anterior chest wall. IMPRESSION: No active cardiopulmonary disease. Electronically Signed   By: Ernie Avena M.D.   On: 05/08/2022 20:02     Data Reviewed: Relevant notes from primary care and specialist visits, past discharge summaries as available in EHR, including Care Everywhere. Prior diagnostic testing as pertinent to current admission diagnoses Updated medications and problem lists for reconciliation ED course, including vitals, labs, imaging, treatment and response to treatment Triage notes, nursing and pharmacy notes and ED provider's notes Notable results as noted in HPI   Assessment and Plan: * Postural dizziness with presyncope Suspect secondary to dehydration from poor oral intake related to acute illness of influenza A Received a 500 mL of bolus of NS in the ED We will continue to hydrate overnight  Generalized weakness Secondary to acute  illness Can consider PT evaluation  Influenza A Continue Tamiflu 30 mg daily to complete 5 days Supportive care Droplet precautions  AKI (acute kidney injury) (HCC) Likely prerenal related to poor oral intake in the setting of acute illness IV hydration and avoid nephrotoxins  Hypothyroidism Continue levothyroxine  History of stroke Continue aspirin and atorvastatin  Elevated troponin Troponin 72 but without complaints of chest pain and EKG nonacute Suspect demand ischemia We will continue to trend  Thrombocytopenia (HCC) Platelet count 1 46,000  Hypokalemia Received oral repletion of 40 mEq in the ED Continue to monitor  Essential hypertension Hold antihypertensives of losartan and propranolol as BP on arrival was soft at 102/69.  Resume as appropriate        DVT prophylaxis: Lovenox  Consults: none  Advance Care Planning:   Code Status: Prior   Family Communication: Daughter at bedside  Disposition Plan: Back to previous home environment  Severity of Illness: The appropriate patient status for this patient is OBSERVATION. Observation status is judged to be reasonable and necessary in order to provide the required intensity of service to ensure the patient's safety. The patient's presenting symptoms, physical exam findings, and initial radiographic and laboratory data in the context of their medical condition is felt to place them at decreased risk for further clinical deterioration. Furthermore, it is anticipated that the patient will be medically stable for discharge from the hospital within 2 midnights of admission.   Author: Andris Baumann, MD 05/08/2022 10:02 PM  For on call review [email or web address].

## 2022-05-08 NOTE — Assessment & Plan Note (Signed)
Suspect secondary to dehydration from poor oral intake related to acute illness of influenza A Received a 500 mL of bolus of NS in the ED We will continue to hydrate overnight

## 2022-05-08 NOTE — ED Triage Notes (Signed)
Daughter also reports pt has had little appetite, cough and congestion, was seen a few weeks ago for same, family member was positive for flu.

## 2022-05-08 NOTE — Assessment & Plan Note (Addendum)
Continue aspirin and atorvastatin. ?

## 2022-05-08 NOTE — Hospital Course (Signed)
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  She returns to the ER due to persistent symptoms.  Daughter reported that members of the household have influenza A.  Patient has had no vomiting or diarrhea or dysuria no fevers or chills and has no complaints of chest pain or shortness of breath. ED course and data review: BP 102/69 with otherwise normal vitals.  Labs significant for normal WBC and hemoglobin, slightly low platelets of 1 46,000.  Potassium 3.1, creatinine 1.67 up from baseline of 0.93.  Influenza A+.  Troponin 72.  EKG, personally viewed and interpreted shows sinus rhythm at 63 with no ischemic ST-T wave changes.  Chest x-ray clear. Patient treated with a NS bolus given a dose of oral potassium and started on Tamiflu 30 mg adjusted for kidney function.  Hospitalist consulted for admission.

## 2022-05-08 NOTE — Assessment & Plan Note (Signed)
Continue levothyroxine 

## 2022-05-08 NOTE — ED Triage Notes (Signed)
Pt presents to the ED via POV due to weakness. Pt c/o intermittent back pain. Pt's daughter states pt has moments of passing out after smoking cigarette. Daughter also mentions pt has been around others who were sick. Pt A&Ox4.

## 2022-05-08 NOTE — Progress Notes (Signed)
PHARMACIST - PHYSICIAN COMMUNICATION  CONCERNING:  Enoxaparin (Lovenox) for DVT Prophylaxis    RECOMMENDATION: Patient was prescribed enoxaprin 40mg  q24 hours for VTE prophylaxis.   Filed Weights   05/08/22 1908  Weight: 54 kg (119 lb)    Body mass index is 21.77 kg/m.  Estimated Creatinine Clearance: 22.7 mL/min (A) (by C-G formula based on SCr of 1.67 mg/dL (H)).    Patient is candidate for enoxaparin 30mg  every 24 hours based on CrCl <42ml/min or Weight <45kg  DESCRIPTION: Pharmacy has adjusted enoxaparin dose per [facility] policy.  Patient is now receiving enoxaparin 30 mg every 24 hours    31m, PharmD Clinical Pharmacist  05/08/2022 10:15 PM

## 2022-05-08 NOTE — Assessment & Plan Note (Signed)
Continue Tamiflu 30 mg daily to complete 5 days Supportive care Droplet precautions

## 2022-05-09 DIAGNOSIS — R55 Syncope and collapse: Secondary | ICD-10-CM | POA: Diagnosis not present

## 2022-05-09 DIAGNOSIS — R42 Dizziness and giddiness: Secondary | ICD-10-CM | POA: Diagnosis not present

## 2022-05-09 LAB — MAGNESIUM: Magnesium: 2 mg/dL (ref 1.7–2.4)

## 2022-05-09 LAB — BASIC METABOLIC PANEL
Anion gap: 3 — ABNORMAL LOW (ref 5–15)
BUN: 30 mg/dL — ABNORMAL HIGH (ref 8–23)
CO2: 21 mmol/L — ABNORMAL LOW (ref 22–32)
Calcium: 8.4 mg/dL — ABNORMAL LOW (ref 8.9–10.3)
Chloride: 112 mmol/L — ABNORMAL HIGH (ref 98–111)
Creatinine, Ser: 1.48 mg/dL — ABNORMAL HIGH (ref 0.44–1.00)
GFR, Estimated: 36 mL/min — ABNORMAL LOW (ref >60)
Glucose, Bld: 77 mg/dL (ref 70–99)
Potassium: 5.5 mmol/L — ABNORMAL HIGH (ref 3.5–5.1)
Sodium: 136 mmol/L (ref 135–145)

## 2022-05-09 MED ORDER — POTASSIUM CHLORIDE 10 MEQ/100ML IV SOLN
10.0000 meq | INTRAVENOUS | Status: AC
  Administered 2022-05-09 (×2): 10 meq via INTRAVENOUS
  Filled 2022-05-09 (×2): qty 100

## 2022-05-09 MED ORDER — OXYMETAZOLINE HCL 0.05 % NA SOLN: 1.0000 | NASAL | Status: DC | PRN

## 2022-05-09 MED ORDER — OSELTAMIVIR PHOSPHATE 30 MG PO CAPS: 30.0000 mg | ORAL_CAPSULE | Freq: Every day | ORAL | 0 refills | Status: AC

## 2022-05-09 MED ORDER — PROPRANOLOL HCL 20 MG PO TABS
20.0000 mg | ORAL_TABLET | Freq: Two times a day (BID) | ORAL | Status: DC
  Administered 2022-05-09: 20 mg via ORAL
  Filled 2022-05-09: qty 1

## 2022-05-09 MED ORDER — SODIUM CHLORIDE 0.9 % IV SOLN: INTRAVENOUS | Status: DC

## 2022-05-09 NOTE — ED Notes (Signed)
Pt brought to ED rm 10 at this time, this RN now assuming care. 

## 2022-05-09 NOTE — Evaluation (Addendum)
Occupational Therapy Evaluation Patient Details Name: Shelby Keller MRN: [id number] DOB: 1946-06-04 Today's Date: 05/09/2022   History of Present Illness 76 y.o. 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   Clinical Impression   Chart reviewed, pt greeted in room agreeable to OT evaluation. Pt is oriented to self, oriented to place after providing 3 choices. Increased time for processing throughout. Per chart this may be pt baseline. Pt endorses feeling weak, however does report she's been weak since before acute illness. Pt performs ADL/mobility with supervision-CGA. Pt reports she feels she is close to her baseline, will have family to assist after dc. Recommend frequent/constant supervision, assist with med management due to cognitive status. Will follow in house, no OT recommended following discharge. OT will continue to follow acutely.      Recommendations for follow up therapy are one component of a multi-disciplinary discharge planning process, led by the attending physician.  Recommendations may be updated based on patient status, additional functional criteria and insurance authorization.   Follow Up Recommendations  No OT follow up     Assistance Recommended at Discharge Frequent or constant Supervision/Assistance  Patient can return home with the following Assistance with cooking/housework;Direct supervision/assist for medications management    Functional Status Assessment  Patient has had a recent decline in their functional status and demonstrates the ability to make significant improvements in function in a reasonable and predictable amount of time.  Equipment Recommendations  None recommended by  OT    Recommendations for Other Services       Precautions / Restrictions Precautions Precautions: Fall Restrictions Weight Bearing Restrictions: No      Mobility Bed Mobility Overal bed mobility: Modified Independent                  Transfers Overall transfer level: Modified independent                        Balance Overall balance assessment: Needs assistance Sitting-balance support: Feet supported Sitting balance-Leahy Scale: Good     Standing balance support: No upper extremity supported Standing balance-Leahy Scale: Good                             ADL either performed or assessed with clinical judgement   ADL Overall ADL's : Needs assistance/impaired                                       General ADL Comments: SET UP for LB dressing, Supervision for grooming tasks, CGA for HHA amb in room/household distances     Vision Patient Visual Report: No change from baseline       Perception     Praxis      Pertinent Vitals/Pain Pain Assessment Pain Assessment: No/denies pain     Hand Dominance     Extremity/Trunk Assessment Upper Extremity Assessment Upper Extremity Assessment: Overall WFL for tasks assessed;Generalized weakness   Lower Extremity Assessment Lower Extremity Assessment: Generalized weakness       Communication Communication Communication: Expressive difficulties (word finding difficulties)   Cognition Arousal/Alertness:  Awake/alert Behavior During Therapy: Flat affect Overall Cognitive Status: No family/caregiver present to determine baseline cognitive functioning Area of Impairment: Orientation, Attention, Memory, Following commands, Safety/judgement, Awareness, Problem solving                 Orientation Level: Disoriented to, Time, Situation, Place Current Attention Level: Sustained Memory: Decreased short-term memory Following Commands: Follows one step commands with  increased time     Problem Solving: Slow processing General Comments: Pleasant, agreeable, reports she has help most of the time at home     General Comments  vital signs monitored, stable throughout; pt reports feeling weak, but then states" I felt week prior to getting sick"    Exercises     Shoulder Instructions      Home Living Family/patient expects to be discharged to:: Private residence Living Arrangements: Spouse/significant other;Children;Other relatives Available Help at Discharge: Family;Available 24 hours/day (per chart) Type of Home: House Home Access: Stairs to enter [employer] of Steps: per chart 4 Entrance Stairs-Rails: None Home Layout: Able to live on main level with bedroom/bathroom     Bathroom Shower/Tub: Sponge bathes at baseline         Home Equipment: Agricultural consultant (2 wheels);Cane - single point          Prior Functioning/Environment Prior Level of Function : Independent/Modified Independent             Mobility Comments: amb with no AD in house, does report she has RW and cane to use PRN ADLs Comments: pt reports she sponge bathes at baseline with SET UP, dressing/grooming with MOD I, assist for all IADL        OT Problem List: Decreased safety awareness;Decreased activity tolerance      OT Treatment/Interventions: Self-care/ADL training;Therapeutic exercise;Therapeutic activities    OT Goals(Current goals can be found in the care plan section) Acute Rehab OT Goals Patient Stated Goal: go home OT Goal Formulation: With patient Time For Goal Achievement: 05/23/22 Potential to Achieve Goals: Good ADL Goals Pt Will Perform Grooming: with modified independence Pt Will Perform Lower Body Dressing: with supervision Pt Will Transfer to Toilet: with supervision Pt Will Perform Toileting - Clothing Manipulation and hygiene: with supervision  OT Frequency: Min 2X/week    Co-evaluation              AM-PAC OT "6  Clicks" Daily Activity     Outcome Measure Help from another person eating meals?: None Help from another person taking care of personal grooming?: None Help from another person toileting, which includes using toliet, bedpan, or urinal?: None Help from another person bathing (including washing, rinsing, drying)?: None Help from another person to put on and taking off regular upper body clothing?: None Help from another person to put on and taking off regular lower body clothing?: None 6 Click Score: 24   End of Session Nurse Communication: Mobility status (vitals)  Activity Tolerance: Patient tolerated treatment well Patient left: in bed;with call bell/phone within reach  OT Visit Diagnosis: Unsteadiness on feet (R26.81)                Time: 3149-7026 OT Time Calculation (min): 9 min Charges:  OT General Charges $OT Visit: 1 Visit OT Evaluation $OT Eval Low Complexity: 1 Low  Oleta Mouse, OTD OTR/L  05/09/22, 3:33 PM

## 2022-05-09 NOTE — Evaluation (Signed)
Physical Therapy Evaluation Patient Details Name: Shelby Keller MRN: [id number] DOB: 1946-03-12 Today's Date: 05/09/2022  History of Present Illness  76 y.o. 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  Clinical Impression  Pt generally confused but pleasant and very willing to work with PT.  She was able to rise to sitting and standing w/o hesitation or need for assist.  She walked to the seat and doffed socks, donned shoes slowly but w/o assist.  She was able to ambulate >100 ft with consistent, but slow, gait.  Occasionally using HHA, wall cuising, but with no LOBs or buckling and generally not needing any UE assist. Pt's O2 and HR stable and appropriate, no c/o dizziness and only mild chronic back pain.  Pt generally confused (did not know date, home set up, etc) but appears to be at/near baseline and was able to do mobility with confidence and relative safety.  Pt safe to return home with consistent supervision, does not likely need further PT follow-up.     Recommendations for follow up therapy are one component of a multi-disciplinary discharge planning process, led by the attending physician.  Recommendations may be updated based on patient status, additional functional criteria and insurance authorization.  Follow Up Recommendations No PT follow up      Assistance Recommended at Discharge Intermittent Supervision/Assistance  Patient can return home with the following  Help with stairs or ramp for entrance;Assistance with cooking/housework    Equipment Recommendations None recommended by PT  Recommendations for Other Services       Functional Status Assessment Patient has not had a recent decline in  their functional status     Precautions / Restrictions Precautions Precautions: Fall Restrictions Weight Bearing Restrictions: No      Mobility  Bed Mobility Overal bed mobility: Modified Independent             General bed mobility comments: Pt able to get herself to/from supine/sit with relative ease    Transfers Overall transfer level: Modified independent Equipment used: None               General transfer comment: Able to rise to standing confidently w/o AD, w/o assist.    Ambulation/Gait Ambulation/Gait assistance: Supervision Gait Distance (Feet): 125 Feet Assistive device: None, 1 person hand held assist         General Gait Details: Pt with slow but steady ambulation.  Initially needing light HHA and then transitioning to no UE use.  She did not have any buckling or LOBs but did have slower, shuffling gait that is likely near baseline.  Pt reports she does not typically use and AD, but did endorse some furniture walker when pressed.  Encouraged her to use am AD if she was not feeling as strong as normal when first returning home.  Stairs            Wheelchair Mobility    Modified Rankin (Stroke Patients Only)       Balance Overall balance assessment: Modified Independent (able to maintain balance with static and dynamic standing tasks, no LOBs or staggering)  Pertinent Vitals/Pain Pain Assessment Pain Assessment: Faces Faces Pain Scale: Hurts a little bit Pain Location: reports chronic back pain    Home Living Family/patient expects to be discharged to:: Private residence Living Arrangements: Spouse/significant other;Children;Other relatives Available Help at Discharge: Family;Available 24 hours/day (notes from last year state 24/7, pt confused but indicates assist at home still available)   Home Access: Stairs to enter Entrance Stairs-Rails:  ("I hold on to someone" prior  documentations states no rails) Entrance Stairs-Number of Steps: "not too many", previous documentation  states 4     Home Equipment: [place] (2 wheels);Cane - single point      Prior Function Prior Level of Function : Independent/Modified Independent             Mobility Comments: She reports she does not get out of the home much, states she does not normally need AD but does endorse furniture walking ADLs Comments: pt indicates that family does not have to help her get dressed, etc     Hand Dominance        Extremity/Trunk Assessment   Upper Extremity Assessment Upper Extremity Assessment: Overall WFL for tasks assessed;Generalized weakness    Lower Extremity Assessment Lower Extremity Assessment: Overall WFL for tasks assessed;Generalized weakness       Communication   Communication:  (appeared to have some word finding problems, states "since the stroke I have trouble with my mind")  Cognition Arousal/Alertness: Awake/alert Behavior During Therapy: Flat affect Overall Cognitive Status:  (unsure of baseline, but appears she started having confusion with CVA 2 years ago)                                          General Comments General comments (skin integrity, edema, etc.): Pt's vitals stable and appropriate t/o session    Exercises     Assessment/Plan    PT Assessment Patient does not need any further PT services  PT Problem List         PT Treatment Interventions      PT Goals (Current goals can be found in the Care Plan section)  Acute Rehab PT Goals Patient Stated Goal: go home PT Goal Formulation: All assessment and education complete, DC therapy    Frequency       Co-evaluation               AM-PAC PT "6 Clicks" Mobility  Outcome Measure Help needed turning from your back to your side while in a flat bed without using bedrails?: None Help needed moving from lying on your back to sitting on the side of a flat  bed without using bedrails?: None Help needed moving to and from a bed to a chair (including a wheelchair)?: None Help needed standing up from a chair using your arms (e.g., wheelchair or bedside chair)?: None Help needed to walk in hospital room?: A Little Help needed climbing 3-5 steps with a railing? : A Little 6 Click Score: 22    End of Session Equipment Utilized During Treatment: Gait belt Activity Tolerance: Patient tolerated treatment well Patient left: in bed;with call bell/phone within reach   PT Visit Diagnosis: Unsteadiness on feet (R26.81);Muscle weakness (generalized) (M62.81)    Time: [id number] PT Time Calculation (min) (ACUTE ONLY): 14 min   Charges:   PT Evaluation $PT Eval Low Complexity: 1 Low  Malachi Pro, DPT 05/09/2022, 3:05 PM

## 2022-05-09 NOTE — Discharge Instructions (Signed)
You are seen in observation status in the hospital.  I suspect that you may have passed out due to dehydration.  This is likely worsened by the fact that you are on 2 different blood pressure medications and have not been eating well.  You have also contracted influenza A.  On discharge I recommend not taking your losartan for period of 4 to 5 days after discharge.  This will allow for enough time for your dehydration to correct and for you to get over the major symptoms of the flu.  I recommend acquiring a blood pressure cuff for home use.  Try and check your blood pressure 1 to twice a day approximately the same time each day.  I strongly recommend adequate hydration while you are dealing with the flu.  It is easy to become dehydrated while recovering from viral illness.  Also strongly recommend smoking cessation.  Please follow-up with your primary care physician within 1 to 2 weeks of discharge.

## 2022-05-09 NOTE — Progress Notes (Signed)
Brief progress note.  Full note to follow.  Briefly this is a 76 year old female who presented to the emergency room at the request of her daughter after 1 syncopal event and another presyncopal event.  Per the patient's daughter her husband had contracted influenza.  Over the past few days the patient has not been eating much.  She is an active smoker and has been smoking less.  She is a hypertensive on losartan and takes propranolol presumably for essential tremors.  I suspect that syncopal events were driven by intravascular volume depletion in the setting of poor p.o. intake, influenza infection, continuation of losartan.  Kidney function improving.  Patient on IV fluids.  I suspect the patient will be stable for discharge from the hospital today.  I requested evaluations for physical therapy and Occupational Therapy.  Discharge this afternoon pending their evaluation and recommendations.  Lolita Patella MD  No charge

## 2022-05-09 NOTE — ED Notes (Addendum)
Pt had brief episode of VTAch then returned to NS. MD made aware.

## 2022-05-09 NOTE — Discharge Summary (Signed)
Physician Discharge Summary  Shelby PaschalJudy C Keller [id number] DOB: June 08, 1945 DOA: 05/08/2022  PCP: [name], Miriam K, MD  Admit date: 05/08/2022 Discharge date: 05/09/2022  Admitted From: Home Disposition: Home  Recommendations for Outpatient Follow-up:  Follow up with PCP in 1-2 weeks   Home Health: No Equipment/Devices: None  Discharge Condition: Stable CODE STATUS: Full Diet recommendation: Regular  Brief/Interim Summary: 76 year old female who presented to the emergency room at the request of her daughter after 1 syncopal event and another presyncopal event. Per the patient's daughter her husband had contracted influenza. Over the past few days the patient has not been eating much. She is an active smoker and has been smoking less. She is a hypertensive on losartan and takes propranolol presumably for essential tremors. I suspect that syncopal events were driven by intravascular volume depletion in the setting of poor p.o. intake, influenza infection, continuation of losartan. Kidney function improving. Patient on IV fluids.   Patient was seen by physical therapy and Occupational Therapy.  No follow-up recommended.  Patient ambulating well.  Physical therapist did note some confusion on ambulation otherwise stable for discharge from their standpoint.  Recommend patient hold her losartan for 4 to 5 days postdischarge.  She can restart at that time.  Recommend smoking cessation and follow-up with primary care physician within 1 to 2 weeks of discharge.    Discharge Diagnoses:  Principal Problem:   Postural dizziness with presyncope Active Problems:   Influenza A   Generalized weakness   AKI (acute kidney injury) (HCC)   Essential hypertension   Hypokalemia   Thrombocytopenia (HCC)   Elevated troponin   History of stroke   Hypothyroidism  Syncope Influenza AKI Dehydration Patient presented after 2 syncopal events.  Felt to be due to dehydration in the setting of influenza  A infection, ongoing losartan use, smoking.  Recommend discharge home.  4 additional days of Tamiflu prescribed.  Hold lisinopril for 4 to 5 days postdischarge.  Can resume once symptoms resolve.  Discharge Instructions  Discharge Instructions     Diet - low sodium heart healthy   Complete by: As directed    Increase activity slowly   Complete by: As directed       Allergies as of 05/09/2022       Reactions   Lisinopril Cough        Medication List     STOP taking these medications    losartan 50 MG tablet Commonly known as: COZAAR   potassium chloride SA 20 MEQ tablet Commonly known as: Klor-Con M20       TAKE these medications    aspirin EC 81 MG tablet Take 1 tablet (81 mg total) by mouth daily. Swallow whole.   atorvastatin 40 MG tablet Commonly known as: LIPITOR Take 1 tablet (40 mg total) by mouth daily.   doxylamine (Sleep) 25 MG tablet Commonly known as: UNISOM Take 25 mg by mouth at bedtime as needed for sleep.   levothyroxine 75 MCG tablet Commonly known as: SYNTHROID Take 75 mcg by mouth daily.   oseltamivir 30 MG capsule Commonly known as: TAMIFLU Take 1 capsule (30 mg total) by mouth daily for 4 days. Start taking on: May 10, 2022   oxymetazoline 0.05 % nasal spray Commonly known as: AFRIN Place 1 spray into both nostrils as needed for congestion.   pantoprazole 40 MG tablet Commonly known as: PROTONIX Take 40 mg by mouth daily as needed (acid reflux).   PARoxetine 40 MG tablet Commonly known as:  PAXIL Take 40 mg by mouth daily.   propranolol 10 MG tablet Commonly known as: INDERAL Take 20 mg by mouth 2 (two) times daily.        Allergies  Allergen Reactions   Lisinopril Cough    Consultations: None   Procedures/Studies: DG Chest 2 View  Result Date: 05/08/2022 CLINICAL DATA:  Cough, chest congestion EXAM: CHEST - 2 VIEW COMPARISON:  None Available. FINDINGS: Cardiac size is within normal limits. Increase in AP  diameter of chest suggests COPD. There are no signs of pulmonary edema or focal pulmonary consolidation. There is no pleural effusion or pneumothorax. There is implantable cardiac monitoring device in left anterior chest wall. IMPRESSION: No active cardiopulmonary disease. Electronically Signed   By: Ernie Avena M.D.   On: 05/08/2022 20:02   CUP PACEART REMOTE DEVICE CHECK  Result Date: 04/27/2022 ILR summary report received. Battery status OK. Normal device function. No new symptom, tachy, brady, or pause episodes. No new AF episodes. Monthly summary reports and ROV/PRN LA     Subjective: Seen and examined on the day of discharge.  A little confused but otherwise stable.  No apparent distress.  Appropriate for discharge home.  Discharge Exam: Vitals:   05/09/22 0830 05/09/22 1000  BP: 136/83 (!) 148/74  Pulse: 68 60  Resp: 16 16  Temp: 97.9 F (36.6 C)   SpO2: 96% 97%   Vitals:   05/09/22 0730 05/09/22 0800 05/09/22 0830 05/09/22 1000  BP: 132/63 111/60 136/83 (!) 148/74  Pulse: 65 66 68 60  Resp:   16 16  Temp:   97.9 F (36.6 C)   TempSrc:   Oral   SpO2: 96% 97% 96% 97%  Weight:      Height:        General: Pt is alert, awake, not in acute distress Cardiovascular: RRR, S1/S2 +, no rubs, no gallops Respiratory: CTA bilaterally, no wheezing, no rhonchi Abdominal: Soft, NT, ND, bowel sounds + Extremities: no edema, no cyanosis    The results of significant diagnostics from this hospitalization (including imaging, microbiology, ancillary and laboratory) are listed below for reference.     Microbiology: Recent Results (from the past 240 hour(s))  Resp Panel by RT-PCR (Flu A&B, Covid) Anterior Nasal Swab     Status: Abnormal   Collection Time: 05/08/22  7:00 PM   Specimen: Anterior Nasal Swab  Result Value Ref Range Status   SARS Coronavirus 2 by RT PCR NEGATIVE NEGATIVE Final    Comment: (NOTE) SARS-CoV-2 target nucleic acids are NOT DETECTED.  The  SARS-CoV-2 RNA is generally detectable in upper respiratory specimens during the acute phase of infection. The lowest concentration of SARS-CoV-2 viral copies this assay can detect is 138 copies/mL. A negative result does not preclude SARS-Cov-2 infection and should not be used as the sole basis for treatment or other patient management decisions. A negative result may occur with  improper specimen collection/handling, submission of specimen other than nasopharyngeal swab, presence of viral mutation(s) within the areas targeted by this assay, and inadequate number of viral copies(<138 copies/mL). A negative result must be combined with clinical observations, patient history, and epidemiological information. The expected result is Negative.  Fact Sheet for Patients:  BloggerCourse.com  Fact Sheet for Healthcare Providers:  SeriousBroker.it  This test is no t yet approved or cleared by the [place] FDA and  has been authorized for detection and/or diagnosis of SARS-CoV-2 by FDA under an Emergency Use Authorization (EUA). This EUA will remain  in effect (meaning this test can be used) for the duration of the COVID-19 declaration under Section 564(b)(1) of the Act, 21 U.S.C.section 360bbb-3(b)(1), unless the authorization is terminated  or revoked sooner.       Influenza A by PCR POSITIVE (A) NEGATIVE Final   Influenza B by PCR NEGATIVE NEGATIVE Final    Comment: (NOTE) The Xpert Xpress SARS-CoV-2/FLU/RSV plus assay is intended as an aid in the diagnosis of influenza from Nasopharyngeal swab specimens and should not be used as a sole basis for treatment. Nasal washings and aspirates are unacceptable for Xpert Xpress SARS-CoV-2/FLU/RSV testing.  Fact Sheet for Patients: BloggerCourse.com  Fact Sheet for Healthcare Providers: SeriousBroker.it  This test is not yet approved or  cleared by the [place] FDA and has been authorized for detection and/or diagnosis of SARS-CoV-2 by FDA under an Emergency Use Authorization (EUA). This EUA will remain in effect (meaning this test can be used) for the duration of the COVID-19 declaration under Section 564(b)(1) of the Act, 21 U.S.C. section 360bbb-3(b)(1), unless the authorization is terminated or revoked.  Performed at [facility], [address] [place], [place] [address]      Labs: BNP (last 3 results) No results for input(s): "BNP" in the last 8760 hours. Basic Metabolic Panel: Recent Labs  Lab 05/06/22 2121 05/08/22 1908 05/09/22 0627  NA 137 140 136  K 3.2* 3.1* 5.5*  CL 105 106 112*  CO2 23 24 21*  GLUCOSE 143* 130* 77  BUN 17 27* 30*  CREATININE 1.09* 1.67* 1.48*  CALCIUM 9.1 9.1 8.4*  MG  --   --  2.0   Liver Function Tests: No results for input(s): "AST", "ALT", "ALKPHOS", "BILITOT", "PROT", "ALBUMIN" in the last 168 hours. No results for input(s): "LIPASE", "AMYLASE" in the last 168 hours. No results for input(s): "AMMONIA" in the last 168 hours. CBC: Recent Labs  Lab 05/06/22 2121 05/08/22 1908  WBC 3.2* 4.3  HGB 11.9* 12.4  HCT 34.1* 36.5  MCV 90.9 93.1  PLT 143* 146*   Cardiac Enzymes: No results for input(s): "CKTOTAL", "CKMB", "CKMBINDEX", "TROPONINI" in the last 168 hours. BNP: Invalid input(s): "POCBNP" CBG: Recent Labs  Lab 05/06/22 2137 05/08/22 1913  GLUCAP 120* 110*   D-Dimer No results for input(s): "DDIMER" in the last 72 hours. Hgb A1c No results for input(s): "HGBA1C" in the last 72 hours. Lipid Profile No results for input(s): "CHOL", "HDL", "LDLCALC", "TRIG", "CHOLHDL", "LDLDIRECT" in the last 72 hours. Thyroid function studies No results for input(s): "TSH", "T4TOTAL", "T3FREE", "THYROIDAB" in the last 72 hours.  Invalid input(s): "FREET3" Anemia work up No results for input(s): "VITAMINB12", "FOLATE", "FERRITIN", "TIBC", "IRON",  "RETICCTPCT" in the last 72 hours. Urinalysis    Component Value Date/Time   COLORURINE YELLOW (A) 05/06/2022 2121   APPEARANCEUR HAZY (A) 05/06/2022 2121   LABSPEC 1.024 05/06/2022 2121   PHURINE 5.0 05/06/2022 2121   GLUCOSEU NEGATIVE 05/06/2022 2121   HGBUR MODERATE (A) 05/06/2022 2121   BILIRUBINUR NEGATIVE 05/06/2022 2121   KETONESUR 5 (A) 05/06/2022 2121   PROTEINUR 30 (A) 05/06/2022 2121   NITRITE NEGATIVE 05/06/2022 2121   LEUKOCYTESUR NEGATIVE 05/06/2022 2121   Sepsis Labs Recent Labs  Lab 05/06/22 2121 05/08/22 1908  WBC 3.2* 4.3   Microbiology Recent Results (from the past 240 hour(s))  Resp Panel by RT-PCR (Flu A&B, Covid) Anterior Nasal Swab     Status: Abnormal   Collection Time: 05/08/22  7:00 PM   Specimen: Anterior Nasal Swab  Result Value Ref Range Status   SARS Coronavirus 2 by RT PCR NEGATIVE NEGATIVE Final    Comment: (NOTE) SARS-CoV-2 target nucleic acids are NOT DETECTED.  The SARS-CoV-2 RNA is generally detectable in upper respiratory specimens during the acute phase of infection. The lowest concentration of SARS-CoV-2 viral copies this assay can detect is 138 copies/mL. A negative result does not preclude SARS-Cov-2 infection and should not be used as the sole basis for treatment or other patient management decisions. A negative result may occur with  improper specimen collection/handling, submission of specimen other than nasopharyngeal swab, presence of viral mutation(s) within the areas targeted by this assay, and inadequate number of viral copies(<138 copies/mL). A negative result must be combined with clinical observations, patient history, and epidemiological information. The expected result is Negative.  Fact Sheet for Patients:  BloggerCourse.com  Fact Sheet for Healthcare Providers:  SeriousBroker.it  This test is no t yet approved or cleared by the [place] FDA and  has been  authorized for detection and/or diagnosis of SARS-CoV-2 by FDA under an Emergency Use Authorization (EUA). This EUA will remain  in effect (meaning this test can be used) for the duration of the COVID-19 declaration under Section 564(b)(1) of the Act, 21 U.S.C.section 360bbb-3(b)(1), unless the authorization is terminated  or revoked sooner.       Influenza A by PCR POSITIVE (A) NEGATIVE Final   Influenza B by PCR NEGATIVE NEGATIVE Final    Comment: (NOTE) The Xpert Xpress SARS-CoV-2/FLU/RSV plus assay is intended as an aid in the diagnosis of influenza from Nasopharyngeal swab specimens and should not be used as a sole basis for treatment. Nasal washings and aspirates are unacceptable for Xpert Xpress SARS-CoV-2/FLU/RSV testing.  Fact Sheet for Patients: BloggerCourse.com  Fact Sheet for Healthcare Providers: SeriousBroker.it  This test is not yet approved or cleared by the [place] FDA and has been authorized for detection and/or diagnosis of SARS-CoV-2 by FDA under an Emergency Use Authorization (EUA). This EUA will remain in effect (meaning this test can be used) for the duration of the COVID-19 declaration under Section 564(b)(1) of the Act, 21 U.S.C. section 360bbb-3(b)(1), unless the authorization is terminated or revoked.  Performed at [facility], [address] [place], [place] [address]      Time coordinating discharge: Over 30 minutes  SIGNED:   Tresa Moore, MD  [facility] 05/09/2022, 2:56 PM Pager   If 7PM-7AM, please contact night-coverage

## 2022-06-01 ENCOUNTER — Ambulatory Visit (INDEPENDENT_AMBULATORY_CARE_PROVIDER_SITE_OTHER): Payer: Medicare Other

## 2022-06-01 DIAGNOSIS — I639 Cerebral infarction, unspecified: Secondary | ICD-10-CM | POA: Diagnosis not present

## 2022-06-01 LAB — CUP PACEART REMOTE DEVICE CHECK
Date Time Interrogation Session: 20231225231321
Implantable Pulse Generator Implant Date: 20211206

## 2022-06-09 NOTE — Progress Notes (Signed)
Carelink Summary Report / Loop Recorder 

## 2022-06-28 NOTE — Progress Notes (Signed)
[facility] Summary Report / Loop Recorder

## 2022-07-05 ENCOUNTER — Ambulatory Visit: Payer: Medicare Other | Attending: Internal Medicine

## 2022-07-05 DIAGNOSIS — I639 Cerebral infarction, unspecified: Secondary | ICD-10-CM

## 2022-07-06 LAB — CUP PACEART REMOTE DEVICE CHECK
Date Time Interrogation Session: 20240127230645
Implantable Pulse Generator Implant Date: 20211206

## 2022-08-09 ENCOUNTER — Ambulatory Visit (INDEPENDENT_AMBULATORY_CARE_PROVIDER_SITE_OTHER): Payer: Medicare Other

## 2022-08-09 DIAGNOSIS — I639 Cerebral infarction, unspecified: Secondary | ICD-10-CM

## 2022-08-09 LAB — CUP PACEART REMOTE DEVICE CHECK
Date Time Interrogation Session: 20240229230820
Implantable Pulse Generator Implant Date: 20211206

## 2022-08-18 NOTE — Progress Notes (Signed)
Carelink Summary Report / Loop Recorder 

## 2022-09-13 ENCOUNTER — Ambulatory Visit (INDEPENDENT_AMBULATORY_CARE_PROVIDER_SITE_OTHER): Payer: Medicare Other

## 2022-09-13 DIAGNOSIS — I639 Cerebral infarction, unspecified: Secondary | ICD-10-CM | POA: Diagnosis not present

## 2022-09-13 LAB — CUP PACEART REMOTE DEVICE CHECK
Date Time Interrogation Session: 20240407230443
Implantable Pulse Generator Implant Date: 20211206

## 2022-09-16 NOTE — Progress Notes (Signed)
Carelink Summary Report / Loop Recorder 

## 2022-10-18 ENCOUNTER — Ambulatory Visit (INDEPENDENT_AMBULATORY_CARE_PROVIDER_SITE_OTHER): Payer: Medicare Other

## 2022-10-18 DIAGNOSIS — I639 Cerebral infarction, unspecified: Secondary | ICD-10-CM | POA: Diagnosis not present

## 2022-10-19 LAB — CUP PACEART REMOTE DEVICE CHECK
Date Time Interrogation Session: 20240510230407
Implantable Pulse Generator Implant Date: 20211206

## 2022-10-20 NOTE — Progress Notes (Signed)
Carelink Summary Report / Loop Recorder 

## 2022-11-12 NOTE — Progress Notes (Signed)
Carelink Summary Report / Loop Recorder 

## 2022-11-22 ENCOUNTER — Ambulatory Visit (INDEPENDENT_AMBULATORY_CARE_PROVIDER_SITE_OTHER): Payer: Medicare Other

## 2022-11-22 DIAGNOSIS — I639 Cerebral infarction, unspecified: Secondary | ICD-10-CM | POA: Diagnosis not present

## 2022-11-22 LAB — CUP PACEART REMOTE DEVICE CHECK
Date Time Interrogation Session: 20240616230700
Implantable Pulse Generator Implant Date: 20211206

## 2022-12-13 NOTE — Progress Notes (Signed)
Carelink Summary Report / Loop Recorder 

## 2022-12-27 ENCOUNTER — Ambulatory Visit (INDEPENDENT_AMBULATORY_CARE_PROVIDER_SITE_OTHER): Payer: Medicare Other

## 2022-12-27 ENCOUNTER — Other Ambulatory Visit: Payer: Self-pay | Admitting: Physician Assistant

## 2022-12-27 DIAGNOSIS — I639 Cerebral infarction, unspecified: Secondary | ICD-10-CM | POA: Diagnosis not present

## 2022-12-27 DIAGNOSIS — R531 Weakness: Secondary | ICD-10-CM

## 2022-12-27 DIAGNOSIS — R634 Abnormal weight loss: Secondary | ICD-10-CM

## 2022-12-27 LAB — CUP PACEART REMOTE DEVICE CHECK
Date Time Interrogation Session: 20240719230105
Implantable Pulse Generator Implant Date: 20211206

## 2022-12-28 ENCOUNTER — Other Ambulatory Visit: Payer: Self-pay | Admitting: Physician Assistant

## 2022-12-28 DIAGNOSIS — R634 Abnormal weight loss: Secondary | ICD-10-CM

## 2023-01-06 ENCOUNTER — Ambulatory Visit: Admission: RE | Admit: 2023-01-06 | Payer: Medicare Other | Source: Ambulatory Visit

## 2023-01-06 NOTE — Progress Notes (Signed)
Carelink Summary Report / Loop Recorder 

## 2023-01-11 ENCOUNTER — Ambulatory Visit: Admission: RE | Admit: 2023-01-11 | Payer: Medicare Other | Source: Ambulatory Visit

## 2023-01-26 ENCOUNTER — Ambulatory Visit (INDEPENDENT_AMBULATORY_CARE_PROVIDER_SITE_OTHER): Payer: Medicare Other

## 2023-01-26 DIAGNOSIS — I639 Cerebral infarction, unspecified: Secondary | ICD-10-CM | POA: Diagnosis not present

## 2023-01-27 LAB — CUP PACEART REMOTE DEVICE CHECK
Date Time Interrogation Session: 20240821230529
Implantable Pulse Generator Implant Date: 20211206

## 2023-02-09 NOTE — Progress Notes (Signed)
Carelink Summary Report / Loop Recorder 

## 2023-02-28 ENCOUNTER — Ambulatory Visit (INDEPENDENT_AMBULATORY_CARE_PROVIDER_SITE_OTHER): Payer: Medicare Other

## 2023-02-28 DIAGNOSIS — I639 Cerebral infarction, unspecified: Secondary | ICD-10-CM | POA: Diagnosis not present

## 2023-03-01 LAB — CUP PACEART REMOTE DEVICE CHECK
Date Time Interrogation Session: 20240923230523
Implantable Pulse Generator Implant Date: 20211206

## 2023-03-14 NOTE — Progress Notes (Signed)
Carelink Summary Report / Loop Recorder 

## 2023-04-04 ENCOUNTER — Ambulatory Visit (INDEPENDENT_AMBULATORY_CARE_PROVIDER_SITE_OTHER): Payer: Medicare Other

## 2023-04-04 DIAGNOSIS — I639 Cerebral infarction, unspecified: Secondary | ICD-10-CM | POA: Diagnosis not present

## 2023-04-04 LAB — CUP PACEART REMOTE DEVICE CHECK
Date Time Interrogation Session: 20241026230151
Implantable Pulse Generator Implant Date: 20211206

## 2023-04-22 NOTE — Progress Notes (Signed)
Carelink Summary Report / Loop Recorder 

## 2023-05-08 LAB — CUP PACEART REMOTE DEVICE CHECK
Date Time Interrogation Session: 20241130230053
Implantable Pulse Generator Implant Date: 20211206

## 2023-05-09 ENCOUNTER — Ambulatory Visit (INDEPENDENT_AMBULATORY_CARE_PROVIDER_SITE_OTHER): Payer: Medicare Other

## 2023-05-09 DIAGNOSIS — I639 Cerebral infarction, unspecified: Secondary | ICD-10-CM

## 2023-06-13 ENCOUNTER — Ambulatory Visit: Payer: Medicare Other

## 2023-06-13 DIAGNOSIS — I639 Cerebral infarction, unspecified: Secondary | ICD-10-CM

## 2023-06-13 LAB — CUP PACEART REMOTE DEVICE CHECK
Date Time Interrogation Session: 20250105230344
Implantable Pulse Generator Implant Date: 20211206

## 2023-07-06 ENCOUNTER — Encounter: Payer: Self-pay | Admitting: Internal Medicine

## 2023-07-18 ENCOUNTER — Ambulatory Visit (INDEPENDENT_AMBULATORY_CARE_PROVIDER_SITE_OTHER): Payer: Medicare Other

## 2023-07-18 DIAGNOSIS — I639 Cerebral infarction, unspecified: Secondary | ICD-10-CM

## 2023-07-18 LAB — CUP PACEART REMOTE DEVICE CHECK
Date Time Interrogation Session: 20250209230756
Implantable Pulse Generator Implant Date: 20211206

## 2023-07-22 NOTE — Progress Notes (Signed)
[facility] Summary Report / Loop Recorder

## 2023-08-22 ENCOUNTER — Ambulatory Visit (INDEPENDENT_AMBULATORY_CARE_PROVIDER_SITE_OTHER): Payer: Medicare Other

## 2023-08-22 DIAGNOSIS — I639 Cerebral infarction, unspecified: Secondary | ICD-10-CM | POA: Diagnosis not present

## 2023-08-23 ENCOUNTER — Encounter: Payer: Self-pay | Admitting: Internal Medicine

## 2023-08-23 LAB — CUP PACEART REMOTE DEVICE CHECK
Date Time Interrogation Session: 20250316230402
Implantable Pulse Generator Implant Date: 20211206

## 2023-08-24 NOTE — Progress Notes (Signed)
 [facility] Summary Report / Loop Recorder

## 2023-09-19 ENCOUNTER — Encounter: Payer: Self-pay | Admitting: Internal Medicine

## 2023-09-26 ENCOUNTER — Ambulatory Visit (INDEPENDENT_AMBULATORY_CARE_PROVIDER_SITE_OTHER): Payer: Medicare Other

## 2023-09-26 DIAGNOSIS — I639 Cerebral infarction, unspecified: Secondary | ICD-10-CM

## 2023-09-27 LAB — CUP PACEART REMOTE DEVICE CHECK
Date Time Interrogation Session: 20250420230255
Implantable Pulse Generator Implant Date: 20211206

## 2023-10-07 NOTE — Progress Notes (Signed)
 [facility] Summary Report / Loop Recorder

## 2023-10-07 NOTE — Addendum Note (Signed)
 Addended by: Edra Govern D on: 10/07/2023 04:58 PM   Modules accepted: Orders

## 2023-11-01 ENCOUNTER — Ambulatory Visit (INDEPENDENT_AMBULATORY_CARE_PROVIDER_SITE_OTHER): Payer: Medicare Other

## 2023-11-01 DIAGNOSIS — I639 Cerebral infarction, unspecified: Secondary | ICD-10-CM | POA: Diagnosis not present

## 2023-11-01 LAB — CUP PACEART REMOTE DEVICE CHECK
Date Time Interrogation Session: 20250526230803
Implantable Pulse Generator Implant Date: 20211206

## 2023-11-02 ENCOUNTER — Ambulatory Visit: Payer: Self-pay | Admitting: Cardiology

## 2023-11-15 NOTE — Progress Notes (Signed)
 [facility] Summary Report / Loop Recorder

## 2023-11-15 NOTE — Addendum Note (Signed)
 Addended by: Edra Govern D on: 11/15/2023 09:25 AM   Modules accepted: Orders

## 2023-12-01 ENCOUNTER — Ambulatory Visit (INDEPENDENT_AMBULATORY_CARE_PROVIDER_SITE_OTHER)

## 2023-12-01 DIAGNOSIS — I639 Cerebral infarction, unspecified: Secondary | ICD-10-CM

## 2023-12-01 LAB — CUP PACEART REMOTE DEVICE CHECK
Date Time Interrogation Session: 20250625230748
Implantable Pulse Generator Implant Date: 20211206

## 2023-12-04 ENCOUNTER — Ambulatory Visit: Payer: Self-pay | Admitting: Cardiology

## 2023-12-21 NOTE — Progress Notes (Signed)
 [facility] Summary Report / Loop Recorder

## 2024-01-02 ENCOUNTER — Ambulatory Visit (INDEPENDENT_AMBULATORY_CARE_PROVIDER_SITE_OTHER)

## 2024-01-02 DIAGNOSIS — I639 Cerebral infarction, unspecified: Secondary | ICD-10-CM | POA: Diagnosis not present

## 2024-01-02 LAB — CUP PACEART REMOTE DEVICE CHECK
Date Time Interrogation Session: 20250727230809
Implantable Pulse Generator Implant Date: 20211206

## 2024-01-08 ENCOUNTER — Ambulatory Visit: Payer: Self-pay | Admitting: Cardiology

## 2024-02-02 ENCOUNTER — Ambulatory Visit (INDEPENDENT_AMBULATORY_CARE_PROVIDER_SITE_OTHER)

## 2024-02-02 DIAGNOSIS — I639 Cerebral infarction, unspecified: Secondary | ICD-10-CM

## 2024-02-03 LAB — CUP PACEART REMOTE DEVICE CHECK
Date Time Interrogation Session: 20250827231000
Implantable Pulse Generator Implant Date: 20211206

## 2024-02-05 ENCOUNTER — Ambulatory Visit: Payer: Self-pay | Admitting: Cardiology

## 2024-02-10 NOTE — Progress Notes (Signed)
 Remote Loop Recorder Transmission

## 2024-02-23 NOTE — Progress Notes (Signed)
 [facility] Summary Report / Loop Recorder

## 2024-03-05 ENCOUNTER — Ambulatory Visit (INDEPENDENT_AMBULATORY_CARE_PROVIDER_SITE_OTHER)

## 2024-03-05 DIAGNOSIS — I639 Cerebral infarction, unspecified: Secondary | ICD-10-CM | POA: Diagnosis not present

## 2024-03-05 LAB — CUP PACEART REMOTE DEVICE CHECK
Date Time Interrogation Session: 20250928231213
Implantable Pulse Generator Implant Date: 20211206

## 2024-03-07 NOTE — Progress Notes (Signed)
 Remote Loop Recorder Transmission

## 2024-03-08 NOTE — Progress Notes (Signed)
 Remote Loop Recorder Transmission

## 2024-03-11 ENCOUNTER — Ambulatory Visit: Payer: Self-pay | Admitting: Cardiology

## 2024-03-12 ENCOUNTER — Encounter: Payer: Self-pay | Admitting: Physician Assistant

## 2024-03-12 DIAGNOSIS — R531 Weakness: Secondary | ICD-10-CM

## 2024-03-12 DIAGNOSIS — R634 Abnormal weight loss: Secondary | ICD-10-CM

## 2024-03-12 DIAGNOSIS — Z0000 Encounter for general adult medical examination without abnormal findings: Secondary | ICD-10-CM

## 2024-03-12 DIAGNOSIS — R630 Anorexia: Secondary | ICD-10-CM

## 2024-03-13 ENCOUNTER — Encounter: Payer: Self-pay | Admitting: Physician Assistant

## 2024-03-13 ENCOUNTER — Other Ambulatory Visit: Payer: Self-pay | Admitting: Physician Assistant

## 2024-03-13 DIAGNOSIS — Z0000 Encounter for general adult medical examination without abnormal findings: Secondary | ICD-10-CM

## 2024-03-13 DIAGNOSIS — R630 Anorexia: Secondary | ICD-10-CM

## 2024-03-13 DIAGNOSIS — E46 Unspecified protein-calorie malnutrition: Secondary | ICD-10-CM

## 2024-03-13 DIAGNOSIS — R634 Abnormal weight loss: Secondary | ICD-10-CM

## 2024-03-13 DIAGNOSIS — F17200 Nicotine dependence, unspecified, uncomplicated: Secondary | ICD-10-CM

## 2024-03-13 DIAGNOSIS — R531 Weakness: Secondary | ICD-10-CM

## 2024-03-22 ENCOUNTER — Ambulatory Visit

## 2024-04-05 ENCOUNTER — Ambulatory Visit

## 2024-04-05 DIAGNOSIS — I639 Cerebral infarction, unspecified: Secondary | ICD-10-CM

## 2024-04-05 LAB — CUP PACEART REMOTE DEVICE CHECK
Date Time Interrogation Session: 20251029230736
Implantable Pulse Generator Implant Date: 20211206

## 2024-04-08 ENCOUNTER — Ambulatory Visit: Payer: Self-pay | Admitting: Cardiology

## 2024-04-10 NOTE — Progress Notes (Signed)
 Remote Loop Recorder Transmission

## 2024-05-06 ENCOUNTER — Ambulatory Visit

## 2024-05-07 ENCOUNTER — Encounter

## 2024-05-08 ENCOUNTER — Ambulatory Visit: Attending: Cardiology

## 2024-05-08 DIAGNOSIS — I639 Cerebral infarction, unspecified: Secondary | ICD-10-CM

## 2024-05-09 LAB — CUP PACEART REMOTE DEVICE CHECK
Date Time Interrogation Session: 20251201231001
Implantable Pulse Generator Implant Date: 20211206

## 2024-05-15 NOTE — Progress Notes (Signed)
 Remote Loop Recorder Transmission

## 2024-05-25 ENCOUNTER — Ambulatory Visit: Payer: Self-pay | Admitting: Cardiology

## 2024-06-06 ENCOUNTER — Ambulatory Visit

## 2024-06-08 ENCOUNTER — Ambulatory Visit: Attending: Cardiology

## 2024-06-08 DIAGNOSIS — I639 Cerebral infarction, unspecified: Secondary | ICD-10-CM | POA: Diagnosis not present

## 2024-06-08 LAB — CUP PACEART REMOTE DEVICE CHECK
Date Time Interrogation Session: 20260101230527
Implantable Pulse Generator Implant Date: 20211206

## 2024-06-09 ENCOUNTER — Ambulatory Visit: Payer: Self-pay | Admitting: Cardiology

## 2024-06-13 NOTE — Progress Notes (Signed)
 Remote Loop Recorder Transmission

## 2024-07-07 ENCOUNTER — Ambulatory Visit

## 2024-07-09 ENCOUNTER — Ambulatory Visit

## 2024-07-10 LAB — CUP PACEART REMOTE DEVICE CHECK
Date Time Interrogation Session: 20260201230704
Implantable Pulse Generator Implant Date: 20211206

## 2024-08-09 ENCOUNTER — Ambulatory Visit

## 2024-09-09 ENCOUNTER — Ambulatory Visit

## 2024-10-10 ENCOUNTER — Ambulatory Visit

## 2024-11-10 ENCOUNTER — Ambulatory Visit

## 2024-12-11 ENCOUNTER — Ambulatory Visit

## 2025-01-11 ENCOUNTER — Ambulatory Visit

## 2025-02-11 ENCOUNTER — Ambulatory Visit

## 2025-03-14 ENCOUNTER — Ambulatory Visit

## 2025-04-14 ENCOUNTER — Ambulatory Visit

## 2025-05-15 ENCOUNTER — Ambulatory Visit

## 2025-06-15 ENCOUNTER — Ambulatory Visit
